# Patient Record
Sex: Female | Born: 1950 | Race: Black or African American | Hispanic: No | Marital: Single | Smoking: Current every day smoker
Health system: Southern US, Community
[De-identification: ages and names within clinical notes are randomized; demographics above are authoritative.]

## PROBLEM LIST (undated history)

## (undated) DIAGNOSIS — G309 Alzheimer's disease, unspecified: Secondary | ICD-10-CM

## (undated) DIAGNOSIS — R569 Unspecified convulsions: Secondary | ICD-10-CM

## (undated) DIAGNOSIS — I10 Essential (primary) hypertension: Secondary | ICD-10-CM

## (undated) DIAGNOSIS — F0280 Dementia in other diseases classified elsewhere without behavioral disturbance: Secondary | ICD-10-CM

## (undated) HISTORY — PX: ABDOMINAL HYSTERECTOMY: SHX81

---

## 2015-08-08 ENCOUNTER — Inpatient Hospital Stay (HOSPITAL_COMMUNITY): Payer: Federal, State, Local not specified - PPO

## 2015-08-08 ENCOUNTER — Encounter (HOSPITAL_COMMUNITY): Payer: Self-pay | Admitting: *Deleted

## 2015-08-08 ENCOUNTER — Emergency Department (HOSPITAL_COMMUNITY): Payer: Federal, State, Local not specified - PPO

## 2015-08-08 ENCOUNTER — Inpatient Hospital Stay (HOSPITAL_COMMUNITY)
Admission: EM | Admit: 2015-08-08 | Discharge: 2015-08-12 | DRG: 689 | Disposition: A | Discharge status: Home Health | Payer: Federal, State, Local not specified - PPO | Attending: Internal Medicine | Admitting: Internal Medicine

## 2015-08-08 DIAGNOSIS — G309 Alzheimer's disease, unspecified: Secondary | ICD-10-CM | POA: Diagnosis present

## 2015-08-08 DIAGNOSIS — R410 Disorientation, unspecified: Secondary | ICD-10-CM | POA: Diagnosis present

## 2015-08-08 DIAGNOSIS — F1021 Alcohol dependence, in remission: Secondary | ICD-10-CM

## 2015-08-08 DIAGNOSIS — F1010 Alcohol abuse, uncomplicated: Secondary | ICD-10-CM | POA: Diagnosis present

## 2015-08-08 DIAGNOSIS — Z79899 Other long term (current) drug therapy: Secondary | ICD-10-CM | POA: Diagnosis not present

## 2015-08-08 DIAGNOSIS — R935 Abnormal findings on diagnostic imaging of other abdominal regions, including retroperitoneum: Secondary | ICD-10-CM | POA: Diagnosis not present

## 2015-08-08 DIAGNOSIS — K7031 Alcoholic cirrhosis of liver with ascites: Secondary | ICD-10-CM | POA: Insufficient documentation

## 2015-08-08 DIAGNOSIS — N3281 Overactive bladder: Secondary | ICD-10-CM | POA: Diagnosis present

## 2015-08-08 DIAGNOSIS — K769 Liver disease, unspecified: Secondary | ICD-10-CM | POA: Insufficient documentation

## 2015-08-08 DIAGNOSIS — E860 Dehydration: Secondary | ICD-10-CM | POA: Diagnosis present

## 2015-08-08 DIAGNOSIS — N179 Acute kidney failure, unspecified: Secondary | ICD-10-CM | POA: Diagnosis present

## 2015-08-08 DIAGNOSIS — R400 Somnolence: Secondary | ICD-10-CM | POA: Diagnosis present

## 2015-08-08 DIAGNOSIS — R634 Abnormal weight loss: Secondary | ICD-10-CM | POA: Diagnosis not present

## 2015-08-08 DIAGNOSIS — Z7982 Long term (current) use of aspirin: Secondary | ICD-10-CM

## 2015-08-08 DIAGNOSIS — N390 Urinary tract infection, site not specified: Secondary | ICD-10-CM

## 2015-08-08 DIAGNOSIS — N12 Tubulo-interstitial nephritis, not specified as acute or chronic: Principal | ICD-10-CM | POA: Insufficient documentation

## 2015-08-08 DIAGNOSIS — F0280 Dementia in other diseases classified elsewhere without behavioral disturbance: Secondary | ICD-10-CM | POA: Diagnosis present

## 2015-08-08 DIAGNOSIS — E43 Unspecified severe protein-calorie malnutrition: Secondary | ICD-10-CM | POA: Diagnosis present

## 2015-08-08 DIAGNOSIS — R7881 Bacteremia: Secondary | ICD-10-CM | POA: Diagnosis present

## 2015-08-08 DIAGNOSIS — G40909 Epilepsy, unspecified, not intractable, without status epilepticus: Secondary | ICD-10-CM | POA: Diagnosis present

## 2015-08-08 DIAGNOSIS — R630 Anorexia: Secondary | ICD-10-CM | POA: Diagnosis present

## 2015-08-08 DIAGNOSIS — I517 Cardiomegaly: Secondary | ICD-10-CM | POA: Diagnosis present

## 2015-08-08 DIAGNOSIS — I10 Essential (primary) hypertension: Secondary | ICD-10-CM | POA: Diagnosis present

## 2015-08-08 DIAGNOSIS — B9689 Other specified bacterial agents as the cause of diseases classified elsewhere: Secondary | ICD-10-CM | POA: Diagnosis present

## 2015-08-08 DIAGNOSIS — S0003XA Contusion of scalp, initial encounter: Secondary | ICD-10-CM | POA: Diagnosis present

## 2015-08-08 DIAGNOSIS — F17210 Nicotine dependence, cigarettes, uncomplicated: Secondary | ICD-10-CM | POA: Diagnosis present

## 2015-08-08 DIAGNOSIS — B9620 Unspecified Escherichia coli [E. coli] as the cause of diseases classified elsewhere: Secondary | ICD-10-CM | POA: Diagnosis present

## 2015-08-08 DIAGNOSIS — Z681 Body mass index (BMI) 19 or less, adult: Secondary | ICD-10-CM | POA: Diagnosis not present

## 2015-08-08 DIAGNOSIS — W19XXXA Unspecified fall, initial encounter: Secondary | ICD-10-CM | POA: Diagnosis present

## 2015-08-08 DIAGNOSIS — R64 Cachexia: Secondary | ICD-10-CM | POA: Diagnosis present

## 2015-08-08 DIAGNOSIS — Y929 Unspecified place or not applicable: Secondary | ICD-10-CM | POA: Diagnosis not present

## 2015-08-08 DIAGNOSIS — R52 Pain, unspecified: Secondary | ICD-10-CM

## 2015-08-08 DIAGNOSIS — K7460 Unspecified cirrhosis of liver: Secondary | ICD-10-CM | POA: Diagnosis present

## 2015-08-08 DIAGNOSIS — Z9181 History of falling: Secondary | ICD-10-CM | POA: Diagnosis not present

## 2015-08-08 DIAGNOSIS — I739 Peripheral vascular disease, unspecified: Secondary | ICD-10-CM | POA: Diagnosis present

## 2015-08-08 DIAGNOSIS — A4151 Sepsis due to Escherichia coli [E. coli]: Secondary | ICD-10-CM | POA: Insufficient documentation

## 2015-08-08 HISTORY — DX: Dementia in other diseases classified elsewhere, unspecified severity, without behavioral disturbance, psychotic disturbance, mood disturbance, and anxiety: F02.80

## 2015-08-08 HISTORY — DX: Unspecified convulsions: R56.9

## 2015-08-08 HISTORY — DX: Essential (primary) hypertension: I10

## 2015-08-08 HISTORY — DX: Alzheimer's disease, unspecified: G30.9

## 2015-08-08 LAB — URINALYSIS, ROUTINE W REFLEX MICROSCOPIC
GLUCOSE, UA: NEGATIVE mg/dL
Ketones, ur: NEGATIVE mg/dL
Nitrite: NEGATIVE
PROTEIN: 30 mg/dL — AB
SPECIFIC GRAVITY, URINE: 1.013 (ref 1.005–1.030)
Urobilinogen, UA: 1 mg/dL (ref 0.0–1.0)
pH: 5.5 (ref 5.0–8.0)

## 2015-08-08 LAB — CBC
HEMATOCRIT: 35 % — AB (ref 36.0–46.0)
HEMOGLOBIN: 11.5 g/dL — AB (ref 12.0–15.0)
MCH: 27.2 pg (ref 26.0–34.0)
MCHC: 32.9 g/dL (ref 30.0–36.0)
MCV: 82.7 fL (ref 78.0–100.0)
Platelets: 192 10*3/uL (ref 150–400)
RBC: 4.23 MIL/uL (ref 3.87–5.11)
RDW: 14.9 % (ref 11.5–15.5)
WBC: 17.3 10*3/uL — ABNORMAL HIGH (ref 4.0–10.5)

## 2015-08-08 LAB — URINE MICROSCOPIC-ADD ON

## 2015-08-08 LAB — CBG MONITORING, ED: Glucose-Capillary: 83 mg/dL (ref 65–99)

## 2015-08-08 LAB — COMPREHENSIVE METABOLIC PANEL
ALBUMIN: 3 g/dL — AB (ref 3.5–5.0)
ALK PHOS: 78 U/L (ref 38–126)
ALT: 14 U/L (ref 14–54)
AST: 33 U/L (ref 15–41)
Anion gap: 14 (ref 5–15)
BILIRUBIN TOTAL: 1.1 mg/dL (ref 0.3–1.2)
BUN: 33 mg/dL — AB (ref 6–20)
CO2: 17 mmol/L — ABNORMAL LOW (ref 22–32)
CREATININE: 1.81 mg/dL — AB (ref 0.44–1.00)
Calcium: 9.3 mg/dL (ref 8.9–10.3)
Chloride: 103 mmol/L (ref 101–111)
GFR calc Af Amer: 33 mL/min — ABNORMAL LOW (ref >60)
GFR, EST NON AFRICAN AMERICAN: 29 mL/min — AB (ref >60)
GLUCOSE: 113 mg/dL — AB (ref 65–99)
POTASSIUM: 4.7 mmol/L (ref 3.5–5.1)
Sodium: 134 mmol/L — ABNORMAL LOW (ref 135–145)
Total Protein: 7.5 g/dL (ref 6.5–8.1)

## 2015-08-08 LAB — VITAMIN B12: Vitamin B-12: 3509 pg/mL — ABNORMAL HIGH (ref 180–914)

## 2015-08-08 LAB — TSH: TSH: 1.462 u[IU]/mL (ref 0.350–4.500)

## 2015-08-08 MED ORDER — DEXTROSE 5 % IV SOLN: 1.0000 g | INTRAVENOUS | Status: DC

## 2015-08-08 MED ORDER — SENNA 8.6 MG PO TABS
1.0000 | ORAL_TABLET | Freq: Two times a day (BID) | ORAL | Status: DC
  Administered 2015-08-08 – 2015-08-12 (×7): 8.6 mg via ORAL
  Filled 2015-08-08 (×8): qty 1

## 2015-08-08 MED ORDER — ONDANSETRON HCL 4 MG/2ML IJ SOLN: 4.0000 mg | Freq: Four times a day (QID) | INTRAMUSCULAR | Status: DC | PRN

## 2015-08-08 MED ORDER — DEXTROSE 5 % IV SOLN
1.0000 g | Freq: Once | INTRAVENOUS | Status: AC
  Administered 2015-08-08: 1 g via INTRAVENOUS
  Filled 2015-08-08: qty 10

## 2015-08-08 MED ORDER — ADULT MULTIVITAMIN W/MINERALS CH
1.0000 | ORAL_TABLET | Freq: Every day | ORAL | Status: DC
  Administered 2015-08-08 – 2015-08-12 (×5): 1 via ORAL
  Filled 2015-08-08 (×5): qty 1

## 2015-08-08 MED ORDER — LEVETIRACETAM 250 MG PO TABS
250.0000 mg | ORAL_TABLET | Freq: Two times a day (BID) | ORAL | Status: DC
  Administered 2015-08-08 – 2015-08-12 (×7): 250 mg via ORAL
  Filled 2015-08-08 (×8): qty 1

## 2015-08-08 MED ORDER — FOLIC ACID 1 MG PO TABS
1.0000 mg | ORAL_TABLET | Freq: Every day | ORAL | Status: DC
  Administered 2015-08-08 – 2015-08-12 (×5): 1 mg via ORAL
  Filled 2015-08-08 (×5): qty 1

## 2015-08-08 MED ORDER — SODIUM CHLORIDE 0.9 % IV BOLUS (SEPSIS)
1000.0000 mL | Freq: Once | INTRAVENOUS | Status: AC
  Administered 2015-08-08: 1000 mL via INTRAVENOUS

## 2015-08-08 MED ORDER — ONDANSETRON HCL 4 MG PO TABS: 4.0000 mg | ORAL_TABLET | Freq: Four times a day (QID) | ORAL | Status: DC | PRN

## 2015-08-08 MED ORDER — ENOXAPARIN SODIUM 40 MG/0.4ML ~~LOC~~ SOLN
40.0000 mg | SUBCUTANEOUS | Status: DC
  Administered 2015-08-08 – 2015-08-11 (×4): 40 mg via SUBCUTANEOUS
  Filled 2015-08-08 (×4): qty 0.4

## 2015-08-08 MED ORDER — ASPIRIN EC 81 MG PO TBEC
81.0000 mg | DELAYED_RELEASE_TABLET | Freq: Every day | ORAL | Status: DC
  Administered 2015-08-08 – 2015-08-12 (×5): 81 mg via ORAL
  Filled 2015-08-08 (×5): qty 1

## 2015-08-08 MED ORDER — ACETAMINOPHEN 650 MG RE SUPP: 650.0000 mg | Freq: Four times a day (QID) | RECTAL | Status: DC | PRN

## 2015-08-08 MED ORDER — RISPERIDONE 0.5 MG PO TABS
2.0000 mg | ORAL_TABLET | Freq: Two times a day (BID) | ORAL | Status: DC
  Administered 2015-08-08 – 2015-08-09 (×2): 2 mg via ORAL
  Filled 2015-08-08 (×2): qty 4

## 2015-08-08 MED ORDER — SODIUM CHLORIDE 0.9 % IV SOLN
INTRAVENOUS | Status: DC
  Administered 2015-08-08 – 2015-08-11 (×4): via INTRAVENOUS

## 2015-08-08 MED ORDER — VITAMIN B-1 100 MG PO TABS
100.0000 mg | ORAL_TABLET | Freq: Every day | ORAL | Status: DC
  Administered 2015-08-08 – 2015-08-12 (×5): 100 mg via ORAL
  Filled 2015-08-08 (×5): qty 1

## 2015-08-08 MED ORDER — ACETAMINOPHEN 325 MG PO TABS: 650.0000 mg | ORAL_TABLET | Freq: Four times a day (QID) | ORAL | Status: DC | PRN

## 2015-08-08 NOTE — ED Provider Notes (Signed)
CSN: [id number]     Arrival date & time 08/08/15  1225 History   First MD Initiated Contact with Patient 08/08/15 1329     Chief Complaint  Patient presents with  . Altered Mental Status  . Fever     (Consider location/radiation/quality/duration/timing/severity/associated sxs/prior Treatment) HPI Patient presents with her sister who provide the history of present illness. Sister reports that patient has a notable change in interactivity for the past 2 weeks, with listlessness, anorexia, no vomiting, mild fever. Patient cannot provide the details, as she is listless, a phasic, level V caveat. Sister states that the patient was diagnosed with dementia, earlier this year, but has not followed up with neurology. During this illness of 2 weeks, the patient has been to one emergency department, with diagnosis of dehydration.  Past Medical History  Diagnosis Date  . Hypertension   . Seizures (HCC)   . Alzheimer's dementia    Past Surgical History  Procedure Laterality Date  . Abdominal hysterectomy     No family history on file. Social History  Substance Use Topics  . Smoking status: Current Every Day Smoker  . Smokeless tobacco: None  . Alcohol Use: Yes     Comment: used to drink daily until february and then she quit   OB History    No data available     Review of Systems  Unable to perform ROS: Acuity of condition      Allergies  Review of patient's allergies indicates no known allergies.  Home Medications   Prior to Admission medications   Medication Sig Start Date End Date Taking? Authorizing Provider  aMILoride (MIDAMOR) 5 MG tablet Take 5 mg by mouth daily.   Yes Historical Provider, MD  aspirin EC 81 MG tablet Take 81 mg by mouth daily.   Yes Historical Provider, MD  Cyanocobalamin (VITAMIN B 12 PO) Take 2,000 mcg by mouth daily.   Yes Historical Provider, MD  levETIRAcetam (KEPPRA) 250 MG tablet Take 250 mg by mouth 2 (two) times daily.   Yes Historical  Provider, MD  lisinopril (PRINIVIL,ZESTRIL) 40 MG tablet Take 40 mg by mouth daily.   Yes Historical Provider, MD  mirabegron ER (MYRBETRIQ) 25 MG TB24 tablet Take 25 mg by mouth daily.   Yes Historical Provider, MD  Multiple Vitamins-Minerals (MULTIVITAMIN WITH MINERALS) tablet Take 1 tablet by mouth daily.   Yes Historical Provider, MD  niacin 250 MG tablet Take 250 mg by mouth at bedtime.   Yes Historical Provider, MD  Potassium 99 MG TABS Take 99 mg by mouth daily.   Yes Historical Provider, MD  risperiDONE (RISPERDAL) 2 MG tablet Take 2 mg by mouth 2 (two) times daily.   Yes Historical Provider, MD   BP 132/82 mmHg  Pulse 75  Temp(Src) 98.6 F (37 C) (Oral)  Resp 16  SpO2 99% Physical Exam  Constitutional: She is oriented to person, place, and time. She appears listless. She appears cachectic. She has a sickly appearance. No distress.  HENT:  Head: Normocephalic and atraumatic.  Eyes: Conjunctivae and EOM are normal.  Cardiovascular: Normal rate and regular rhythm.   Pulmonary/Chest: Effort normal and breath sounds normal. No stridor. No respiratory distress.  Abdominal: She exhibits no distension.  Musculoskeletal: She exhibits no edema.  Neurological: She is oriented to person, place, and time. She appears listless. She displays atrophy. She displays no tremor. No cranial nerve deficit. She exhibits abnormal muscle tone. She displays no seizure activity.  Patient does not follow commands  reliably, is minimally awake, minimally verbal. No gross asymmetry of strength.  Skin: Skin is warm and dry.  Psychiatric: Cognition and memory are impaired. She is noncommunicative.  Nursing note and vitals reviewed.   ED Course  Procedures (including critical care time) Labs Review Labs Reviewed  COMPREHENSIVE METABOLIC PANEL - Abnormal; Notable for the following:    Sodium 134 (*)    CO2 17 (*)    Glucose, Bld 113 (*)    BUN 33 (*)    Creatinine, Ser 1.81 (*)    Albumin 3.0 (*)     GFR calc non Af Amer 29 (*)    GFR calc Af Amer 33 (*)    All other components within normal limits  CBC - Abnormal; Notable for the following:    WBC 17.3 (*)    Hemoglobin 11.5 (*)    HCT 35.0 (*)    All other components within normal limits  URINALYSIS, ROUTINE W REFLEX MICROSCOPIC (NOT AT [facility]) - Abnormal; Notable for the following:    Color, Urine AMBER (*)    APPearance TURBID (*)    Hgb urine dipstick SMALL (*)    Bilirubin Urine SMALL (*)    Protein, ur 30 (*)    Leukocytes, UA LARGE (*)    All other components within normal limits  URINE MICROSCOPIC-ADD ON  CBG MONITORING, ED    Imaging Review Ct Head Wo Contrast  08/08/2015  CLINICAL DATA:  Fall.  History of Alzheimer's dementia. EXAM: CT HEAD WITHOUT CONTRAST TECHNIQUE: Contiguous axial images were obtained from the base of the skull through the vertex without intravenous contrast. COMPARISON:  None. FINDINGS: There is age related volume loss. There is no intracranial mass, hemorrhage, extra-axial fluid collection, midline shift. There is patchy small vessel disease in the centra semiovale bilaterally. Elsewhere gray-white compartments appear normal. No acute infarct is evident. There is a small right frontal scalp hematoma. Bony calvarium appears intact. The mastoid air cells are clear. IMPRESSION: Age related volume loss with patchy periventricular small vessel disease. No intracranial mass, hemorrhage, or extra-axial fluid collection. No acute infarct evident. Right frontal scalp hematoma without evidence of fracture. Electronically Signed   By: Bretta Bang III M.D.   On: 08/08/2015 15:28   I have personally reviewed and evaluated these images and lab results as part of my medical decision-making.   EKG Interpretation   Date/Time:  Monday August 08 2015 12:53:07 EDT Ventricular Rate:  95 PR Interval:  134 QRS Duration: 74 QT Interval:  336 QTC Calculation: 422 R Axis:   77 Text Interpretation:  Normal sinus  rhythm Nonspecific ST abnormality  Abnormal ECG Sinus rhythm Artifact Abnormal ekg Confirmed by Gerhard Munch  MD [phone]) on 08/08/2015 1:39:19 PM     Initial labs notable for elevated creatinine, urinalysis suggests renal dysfunction, head CT demonstrates age appropriate changes, no obvious stroke. Patient is hypoxic, and there is low suspicion for pneumonia.  With concern for urinary tract infection, dehydration, the patient continued to receive IV fluids, ceftriaxone. MDM  Elderly female presents with a family member due to concerns of decreased functionality. Here the patient is minimally awake, listless, not overtly septic. Patient is afebrile, but has evidence for urinary tract infection, with diminished renal function, and given her change in cognitive status, received IV fluids, antibiotics, was admitted for further evaluation and management.  Gerhard Munch, MD 08/08/15 [phone]

## 2015-08-08 NOTE — H&P (Signed)
Date: 08/08/2015               Patient Name:  Carla Marsh MRN: [id number]  DOB: 10-14-1951 Age / Sex: [age] y.o., female   PCP: No primary care provider on file.         Medical Service: Internal Medicine Teaching Service         Attending Physician: Dr. Inez CatalinaEmily B Mullen, MD    First Contact: Dr. Selina CooleyKyle Ferdie Bakken Pager: [phone]  Second Contact: Dr. Jill AlexandersAlexa Richardson Pager: [phone]       After Hours (After 5p/  First Contact Pager: [phone]  weekends / holidays): Second Contact Pager: [phone]   Chief Complaint: "She's been falling," per Carla Marsh sister  History of Present Illness: Carla Marsh is a 64 year old lady with a history of hypertension, seizure disorder, 50 pack-year smoking history, and extensive alcohol abuse history presenting with increasing falls and somnolence. The history was provided by Carla Marsh sister as the patient herself is not cogent.  Carla Marsh sister said she moved from [place] to [place], [place] about 3 years ago to take care of Carla Marsh mother, who has severe Alzheimer's dementia. During that time, Carla Marsh health has declined rapidly, and she's lost about 200 pounds in the last year. She's become more and more demented during that time, but she can feed herself by microwaving, bathe herself, and function quite well with the help from Carla Marsh mother's caretaker.  Two weeks ago, she was hospitalized in [place], and told she had seizures after getting an EEG. She was started on Keppra at that time and has been falling more since then. Today she fell backwards with the dresser and it fell on top of Carla Marsh, but she says she didn't injure herself, and she declines any complaints whatsoever, specifically including night sweats, chills, headache, cough, shortness of breath, dysuria, or diarrhea. She says Carla Marsh sister made Carla Marsh come in, and she is what's bothering Carla Marsh the most.  Carla Marsh last mammogram was last year and reportedly normal, and Carla Marsh last colonoscopy was 10 years ago and was also reportedly normal.  Besides alcohol and tobacco, there's no other drug use the sister is aware of. She continue to smoke but quit drinking back in February after a previous hospitalization. No cancer runs in the family.  In the emergency department, she was afebrile and Carla Marsh vital signs were normal. Labs were notable for a leukocytosis of 17, BMP notable for BUN 33, creatinine 1.81. Urinalysis showed many bacteria and leukocytes, but negative nitrites. Chest x-ray was normal and head CT showed age-related volume loss and a right frontal scalp hematoma, without evidence of an acute bleed or fracture.  Meds: Current Facility-Administered Medications  Medication Dose Route Frequency Provider Last Rate Last Dose  . cefTRIAXone (ROCEPHIN) 1 g in dextrose 5 % 50 mL IVPB  1 g Intravenous Once Gerhard Munchobert Lockwood, MD       Current Outpatient Prescriptions  Medication Sig Dispense Refill  . aMILoride (MIDAMOR) 5 MG tablet Take 5 mg by mouth daily.    Marland Kitchen. aspirin EC 81 MG tablet Take 81 mg by mouth daily.    . Cyanocobalamin (VITAMIN B 12 PO) Take 2,000 mcg by mouth daily.    Marland Kitchen. levETIRAcetam (KEPPRA) 250 MG tablet Take 250 mg by mouth 2 (two) times daily.    Marland Kitchen. lisinopril (PRINIVIL,ZESTRIL) 40 MG tablet Take 40 mg by mouth daily.    . mirabegron ER (MYRBETRIQ) 25 MG TB24 tablet Take 25 mg by mouth daily.    .Marland Kitchen  Multiple Vitamins-Minerals (MULTIVITAMIN WITH MINERALS) tablet Take 1 tablet by mouth daily.    . niacin 250 MG tablet Take 250 mg by mouth at bedtime.    . Potassium 99 MG TABS Take 99 mg by mouth daily.    . risperiDONE (RISPERDAL) 2 MG tablet Take 2 mg by mouth 2 (two) times daily.      Allergies: Allergies as of 08/08/2015  . (No Known Allergies)   Past Medical History  Diagnosis Date  . Hypertension   . Seizures (HCC)   . Alzheimer's dementia    Past Surgical History  Procedure Laterality Date  . Abdominal hysterectomy     Family history: Mother: Alzheimers, HTN, hypothyroidism, CHF Father: MI at 69,  diabetes  Social History   Social History  . Marital Status: Single    Spouse Name: N/A  . Number of Children: N/A  . Years of Education: N/A   Occupational History  . Not on file.   Social History Main Topics  . Smoking status: Current Every Day Smoker  . Smokeless tobacco: Not on file  . Alcohol Use: Yes     Comment: used to drink daily until february and then she quit  . Drug Use: No  . Sexual Activity: Not on file   Other Topics Concern  . Not on file   Social History Narrative  . No narrative on file   Review of Systems  Constitutional: Positive for weight loss. Negative for fever, chills and malaise/fatigue.  Eyes: Negative for blurred vision.  Respiratory: Negative for cough, sputum production and shortness of breath.   Cardiovascular: Negative for chest pain.  Gastrointestinal: Negative for nausea, vomiting, abdominal pain, diarrhea and constipation.  Genitourinary: Negative for dysuria and urgency.  Musculoskeletal: Positive for neck pain. Negative for myalgias.  Skin: Negative for rash.  Neurological: Positive for seizures. Negative for dizziness, tremors, focal weakness, weakness and headaches.  Psychiatric/Behavioral: Positive for substance abuse.   Physical Exam: Blood pressure 136/80, pulse 71, temperature 98.6 F (37 C), temperature source Oral, resp. rate 23, SpO2 100 %. General: cachectic appearing African-American lady, somnolent and minimally interactive HEENT: no scleral icterus, extra-ocular muscles intact, oropharynx without lesions Cardiac: regular rate and rhythm, no rubs, murmurs or gallops Pulm: breathing well, clear to auscultation bilaterally Abd: bowel sounds normal, soft, nondistended, non-tender Ext: warm and well perfused, without pedal edema Lymph: no cervical, axillary, or supraclavicular lymphadenopathy Skin: no rash, hair, or nail changes Neuro: alert and oriented X3, cranial nerves II-XII grossly intact, strength 5/5 throughout,  non-focal  Lab results: Basic Metabolic Panel:  Recent Labs  16/10/96 1257  NA 134*  K 4.7  CL 103  CO2 17*  GLUCOSE 113*  BUN 33*  CREATININE 1.81*  CALCIUM 9.3   Liver Function Tests:  Recent Labs  08/08/15 1257  AST 33  ALT 14  ALKPHOS 78  BILITOT 1.1  PROT 7.5  ALBUMIN 3.0*   CBC:  Recent Labs  08/08/15 1257  WBC 17.3*  HGB 11.5*  HCT 35.0*  MCV 82.7  PLT 192   Urinalysis:  Recent Labs  08/08/15 1500  COLORURINE AMBER*  LABSPEC 1.013  PHURINE 5.5  GLUCOSEU NEGATIVE  HGBUR SMALL*  BILIRUBINUR SMALL*  KETONESUR NEGATIVE  PROTEINUR 30*  UROBILINOGEN 1.0  NITRITE NEGATIVE  LEUKOCYTESUR LARGE*   Imaging results:  Dg Chest 2 View  08/08/2015  CLINICAL DATA:  Losing ability to walk for 1 month. EXAM: CHEST  2 VIEW COMPARISON:  None. FINDINGS: There is no  focal parenchymal opacity. There is no pleural effusion or pneumothorax. There is mild cardiomegaly. The osseous structures are unremarkable. IMPRESSION: No active cardiopulmonary disease. Electronically Signed   By: Elige Ko   On: 08/08/2015 16:12   Ct Head Wo Contrast  08/08/2015  CLINICAL DATA:  Fall.  History of Alzheimer's dementia. EXAM: CT HEAD WITHOUT CONTRAST TECHNIQUE: Contiguous axial images were obtained from the base of the skull through the vertex without intravenous contrast. COMPARISON:  None. FINDINGS: There is age related volume loss. There is no intracranial mass, hemorrhage, extra-axial fluid collection, midline shift. There is patchy small vessel disease in the centra semiovale bilaterally. Elsewhere gray-white compartments appear normal. No acute infarct is evident. There is a small right frontal scalp hematoma. Bony calvarium appears intact. The mastoid air cells are clear. IMPRESSION: Age related volume loss with patchy periventricular small vessel disease. No intracranial mass, hemorrhage, or extra-axial fluid collection. No acute infarct evident. Right frontal scalp hematoma  without evidence of fracture. Electronically Signed   By: Bretta Bang III M.D.   On: 08/08/2015 15:28   Other results: EKG: normal EKG, normal sinus rhythm, nonspecific ST and T waves changes.  Assessment & Plan by Problem: Carla Marsh is a 64 year old African American lady presenting with non-specific symptoms of progressive falls, seizures, and bowel and bladder incontinence, found to have a urinary tract infection. We will treat Carla Marsh with Ceftriaxone and monitor Carla Marsh mental status. Some of this may be post-ictal and dehydration. Carla Marsh more longstanding problem of 200 pound weight loss is certainly concerning as well, and may be caused by an occult malignancy, possibly lung cancer given Carla Marsh longstanding smoking history, HIV, or generalized malnutrition from progressive dementia. This will require an extensive outpatient work-up to come up with the etiology.  Urinary tract infection: Per above. -Will treat with Ceftriaxone for now -Follow urine cultures -NS at 125cc/hr  200 pound weight loss in one year: Per above, differential includes malignancy, HIV, or progressive dementia and malnutrition. This will require an extensive outpatient work-up. -Will obtain FOBT -Ordered HIV -Ordered TSH -Nutrition consult  Hypertension: She is normotensive right now. -Holding amiloride  daily -Holding lisinopril  daily  Seizure disorder: She is not actively seizing. -Continue levetiracetam  twice daily  Alzheimer's dementia: She has baseline dementia and lives with Carla Marsh mom in [place], who also has dementia. -Continue risperidone  twice daily -Continue B12  Alcohol abuse: She quit drinking back in February. -Continue B1 and B9 vitamins  Overactive bladder: Likely contributed to Carla Marsh UTI. -Holding mirabegron   Dispo: Disposition is deferred at this time, awaiting improvement of current medical problems.  The patient does not know have a current PCP (No primary care  provider on file.) and does need an [facility] hospital follow-up appointment after discharge.  The patient does have transportation limitations that hinder transportation to clinic appointments.  Signed: Selina Cooley, MD 08/08/2015, 4:41 PM

## 2015-08-08 NOTE — ED Notes (Signed)
Pt has been sick since February in [place].  To has been having falls and family was told she has seizures.  Pt recently seen and treated for dehydration.  Pt fell yesterday and pulled the dresser over on her.  Pt knows self and year, but seems to week to follow directions.  Family feels like her mind and body are not working together,

## 2015-08-08 NOTE — ED Notes (Signed)
Pt transported to CT ?

## 2015-08-08 NOTE — ED Notes (Signed)
Pt in xray

## 2015-08-09 ENCOUNTER — Inpatient Hospital Stay (HOSPITAL_COMMUNITY): Payer: Federal, State, Local not specified - PPO

## 2015-08-09 DIAGNOSIS — E43 Unspecified severe protein-calorie malnutrition: Secondary | ICD-10-CM | POA: Insufficient documentation

## 2015-08-09 LAB — CBC WITH DIFFERENTIAL/PLATELET
BASOS ABS: 0 10*3/uL (ref 0.0–0.1)
BASOS PCT: 0 %
EOS ABS: 0 10*3/uL (ref 0.0–0.7)
EOS PCT: 0 %
HCT: 30.4 % — ABNORMAL LOW (ref 36.0–46.0)
Hemoglobin: 10.3 g/dL — ABNORMAL LOW (ref 12.0–15.0)
Lymphocytes Relative: 3 %
Lymphs Abs: 0.6 10*3/uL — ABNORMAL LOW (ref 0.7–4.0)
MCH: 27.9 pg (ref 26.0–34.0)
MCHC: 33.9 g/dL (ref 30.0–36.0)
MCV: 82.4 fL (ref 78.0–100.0)
MONO ABS: 2.3 10*3/uL — AB (ref 0.1–1.0)
Monocytes Relative: 12 %
Neutro Abs: 16.1 10*3/uL — ABNORMAL HIGH (ref 1.7–7.7)
Neutrophils Relative %: 85 %
PLATELETS: 197 10*3/uL (ref 150–400)
RBC: 3.69 MIL/uL — AB (ref 3.87–5.11)
RDW: 15 % (ref 11.5–15.5)
WBC: 19 10*3/uL — AB (ref 4.0–10.5)

## 2015-08-09 LAB — COMPREHENSIVE METABOLIC PANEL
ALK PHOS: 81 U/L (ref 38–126)
ALT: 16 U/L (ref 14–54)
ANION GAP: 9 (ref 5–15)
AST: 27 U/L (ref 15–41)
Albumin: 2.3 g/dL — ABNORMAL LOW (ref 3.5–5.0)
BUN: 33 mg/dL — ABNORMAL HIGH (ref 6–20)
CALCIUM: 8.4 mg/dL — AB (ref 8.9–10.3)
CO2: 19 mmol/L — AB (ref 22–32)
CREATININE: 1.48 mg/dL — AB (ref 0.44–1.00)
Chloride: 106 mmol/L (ref 101–111)
GFR, EST AFRICAN AMERICAN: 43 mL/min — AB (ref >60)
GFR, EST NON AFRICAN AMERICAN: 37 mL/min — AB (ref >60)
Glucose, Bld: 98 mg/dL (ref 65–99)
Potassium: 3.8 mmol/L (ref 3.5–5.1)
SODIUM: 134 mmol/L — AB (ref 135–145)
Total Bilirubin: 1 mg/dL (ref 0.3–1.2)
Total Protein: 6.2 g/dL — ABNORMAL LOW (ref 6.5–8.1)

## 2015-08-09 LAB — HIV ANTIBODY (ROUTINE TESTING W REFLEX): HIV SCREEN 4TH GENERATION: NONREACTIVE

## 2015-08-09 LAB — RPR: RPR: NONREACTIVE

## 2015-08-09 MED ORDER — PRO-STAT SUGAR FREE PO LIQD
30.0000 mL | Freq: Two times a day (BID) | ORAL | Status: DC
  Administered 2015-08-09 – 2015-08-12 (×6): 30 mL via ORAL
  Filled 2015-08-09 (×6): qty 30

## 2015-08-09 MED ORDER — PIPERACILLIN-TAZOBACTAM 3.375 G IVPB 30 MIN
3.3750 g | INTRAVENOUS | Status: AC
  Administered 2015-08-09: 3.375 g via INTRAVENOUS
  Filled 2015-08-09: qty 50

## 2015-08-09 MED ORDER — SODIUM CHLORIDE 0.9 % IV BOLUS (SEPSIS)
1000.0000 mL | Freq: Once | INTRAVENOUS | Status: AC
  Administered 2015-08-09: 1000 mL via INTRAVENOUS

## 2015-08-09 MED ORDER — ENSURE ENLIVE PO LIQD
237.0000 mL | Freq: Three times a day (TID) | ORAL | Status: DC
  Administered 2015-08-09 – 2015-08-12 (×9): 237 mL via ORAL
  Filled 2015-08-09 (×13): qty 237

## 2015-08-09 MED ORDER — PIPERACILLIN-TAZOBACTAM 3.375 G IVPB
3.3750 g | Freq: Three times a day (TID) | INTRAVENOUS | Status: DC
  Administered 2015-08-09 – 2015-08-10 (×3): 3.375 g via INTRAVENOUS
  Filled 2015-08-09 (×4): qty 50

## 2015-08-09 NOTE — Progress Notes (Signed)
Initial Nutrition Assessment  DOCUMENTATION CODES:   Underweight, Severe malnutrition in context of chronic illness  INTERVENTION:  - Order Ensure Enlive TID (each supplement contains 350 kcal, and 20 gm protein).  - Order 30 mL Pro-Stat BID (each supplement contains 100 kcal and 15 gm protein).  - Recommend appetite stimulant for patient to improve poor PO intake.  - RD team will continue to monitor patient for needs.   NUTRITION DIAGNOSIS:   Malnutrition related to lethargy/confusion as evidenced by severe depletion of body fat, severe depletion of muscle mass, percent weight loss.   GOAL:   Patient will meet greater than or equal to 90% of their needs, Weight gain   MONITOR:   PO intake, Supplement acceptance, Labs, Weight trends, TF tolerance, Skin, I & O's  REASON FOR ASSESSMENT:   Consult Assessment of nutrition requirement/status  ASSESSMENT:   64 y.o. female admitted for UTI. Patient's prior medical history includes: HTN, seizures, smoking, dementia, 50 pack-year smoking history, and extensive alcohol abuse history presenting with increasing falls and somnolence. Patietn's admission history was provided by her sister as the patient herself was not cogent.  Patient was sleepy during visit with moderate alertness and responsiveness to questions, and only minimal historical data was able to be obtained. Patient visited for consult due to recent weight loss of 200 lbs per sister.   Per nurse, patient's PO intake is poor, and for breakfast she only ate a few spoonfuls of her tray. Patient affirms having a poor appetite for several days, having last felt a normal appetite over the weekend. Patient denied any taste changes, nausea or vomiting.   There was minimal anthropometric data in chart at initial assessment. Per patient, she described her height as 6'1" and recent weight as 110 lbs. Her current weight is 113.7 lbs. Per patient, she described a recent weight loss of 50  lbs over the past 5-6 months. This 30% weight loss is significant for the time frame.   NFPE was performed, and patient has moderate to severe fat wasting: orbital, upper arm; severe muscle wasting: knee, calves, thighs, temple, clavicle, and shoulders. Patient meets criteria for severe malnutrition in chronic illness.   Patient affirmed that she has used supplements previously and was familiar with Ensure Enlive. Will order supplements for patients and monitor acceptance.   Per chart, patient will most likely discharge to a skilled nursing facility for continued care.   Labs: low Na (134), high BUN (33), high Cr (1.48), low Ca (8.4), low Heme (10.3), high Vit B12  Medications reviewed.     Diet Order:  Diet NPO time specified  Skin:  Reviewed, no issues  Last BM:  PTA  Height:   Ht Readings from Last 1 Encounters:  08/09/15 6\' 1"  (1.854 m)    Weight:   Wt Readings from Last 1 Encounters:  08/09/15 113 lb 11.2 oz (51.574 kg)    Ideal Body Weight:  75 kg  BMI:  Body mass index is 15 kg/(m^2).  Estimated Nutritional Needs:   Kcal:  1500-1700 kcal  Protein:  65-75 gm  Fluid:  1.5 - 1.7 L/day  EDUCATION NEEDS:   Education needs no appropriate at this time  Delano MetzMaggie Andros Channing, Dietetic Intern 08/09/2015 5:42 PM

## 2015-08-09 NOTE — Progress Notes (Signed)
Occupational Therapy Evaluation Patient Details Name: Carla Marsh MRN: [id number] DOB: 12-14-1950 Today's Date: 08/09/2015    History of Present Illness Adm 10/24 with recent falls and confusion; + UTI  PMHx-200 lb weight loss past year; progressive dementia, seizures, tobacco and ETOH abuse   Clinical Impression   Pt admitted with the above diagnoses and presents with below problem list. Pt will benefit from continued acute OT to address the below listed deficits and maximize independence with BADLs prior to d/c to venue below. Per chart, she lived with her mother (also has dementia) and her mother's caregiver assisted her at times. Pt is currently mod to max A for most ADLs. OT to continue to follow acutely.      Follow Up Recommendations  SNF    Equipment Recommendations  Other (comment) (defer to next venue)    Recommendations for Other Services       Precautions / Restrictions Precautions Precautions: Fall      Mobility Bed Mobility Overal bed mobility: Needs Assistance Bed Mobility: Supine to Sit;Sit to Supine     Supine to sit: HOB elevated;Max assist Sit to supine: Max assist;HOB elevated   General bed mobility comments: Pt with flat affect during session, denied feeling tired. Pt with decreased initiation of movements with multimodal cues provided.   Transfers Overall transfer level: Needs assistance Equipment used: None Transfers: Sit to/from Stand Sit to Stand: Min assist         General transfer comment: used grab bars. min A for balance.    Balance Overall balance assessment: Needs assistance;History of Falls Sitting-balance support: Single extremity supported;Feet supported Sitting balance-Leahy Scale: Fair Sitting balance - Comments: sat with mostly RUE support for about 5 minutes.    Standing balance support: Single extremity supported;During functional activity Standing balance-Leahy Scale: Poor Standing balance comment: utilied bed  rail and min A from therapist. Stood EOB about 20 seconds before initiating sitting down                            ADL Overall ADL's : Needs assistance/impaired Eating/Feeding: Sitting;Moderate assistance   Grooming: Sitting;Moderate assistance   Upper Body Bathing: Maximal assistance;Sitting   Lower Body Bathing: Maximal assistance;Sit to/from stand Lower Body Bathing Details (indicate cue type and reason): min A for balance, max A for task completion Upper Body Dressing : Moderate assistance;Sitting   Lower Body Dressing: Maximal assistance;Sit to/from stand Lower Body Dressing Details (indicate cue type and reason): min A for balance, max A for task completion   Toilet Transfer Details (indicate cue type and reason): pt declined ambulation, likely mod A for SPT or short distance ambulation Toileting- Clothing Manipulation and Hygiene: Total assistance     Tub/Shower Transfer Details (indicate cue type and reason): pt declined ambulation, likely mod A for SPT or short distance ambulation Functional mobility during ADLs:  (pt declined functional mobility) General ADL Comments: Pt completed bed mobility with max to total assist. Pt initially refusing standing even when explained the need to change soiled pad. Pt completed sit<>stand with min A and stood about 20 seconds. Declined further functional mobility. No caregivers present during session.      Vision     Perception     Praxis      Pertinent Vitals/Pain Pain Assessment: No/denies pain     Hand Dominance Right   Extremity/Trunk Assessment Upper Extremity Assessment Upper Extremity Assessment: Generalized weakness;Difficult to assess due to impaired cognition  Lower Extremity Assessment Lower Extremity Assessment: Defer to PT evaluation;Generalized weakness;Difficult to assess due to impaired cognition       Communication Communication Communication: No difficulties   Cognition Arousal/Alertness:  Lethargic Behavior During Therapy: Flat affect Overall Cognitive Status: No family/caregiver present to determine baseline cognitive functioning                     General Comments       Exercises       Shoulder Instructions      Home Living Family/patient expects to be discharged to:: Unsure                                 Additional Comments: pt confused and no family present      Prior Functioning/Environment Level of Independence: Needs assistance        Comments: per chart, she lived with her mother (also has dementia) and her mother's caregiver assisted her at times    OT Diagnosis: Generalized weakness;Cognitive deficits;Altered mental status   OT Problem List: Decreased strength;Decreased activity tolerance;Impaired balance (sitting and/or standing);Decreased cognition;Decreased knowledge of use of DME or AE;Decreased safety awareness;Decreased knowledge of precautions   OT Treatment/Interventions: Self-care/ADL training;Therapeutic exercise;DME and/or AE instruction;Therapeutic activities;Patient/family education;Balance training;Cognitive remediation/compensation    OT Goals(Current goals can be found in the care plan section) Acute Rehab OT Goals Patient Stated Goal: get out of here OT Goal Formulation: With patient Time For Goal Achievement: 08/23/15 Potential to Achieve Goals: Good ADL Goals Pt Will Perform Grooming: with set-up;sitting Pt Will Perform Upper Body Bathing: with supervision;sitting Pt Will Perform Lower Body Bathing: with supervision;sit to/from stand Pt Will Perform Upper Body Dressing: with supervision;sitting Pt Will Perform Lower Body Dressing: with supervision;sit to/from stand Pt Will Transfer to Toilet: with supervision;ambulating;bedside commode Pt Will Perform Toileting - Clothing Manipulation and hygiene: with supervision;sitting/lateral leans;sit to/from stand Pt Will Perform Tub/Shower Transfer: with min  guard assist;ambulating;shower seat Pt/caregiver will Perform Home Exercise Program: Increased strength;Both right and left upper extremity;With written HEP provided  OT Frequency: Min 2X/week   Barriers to D/C:            Co-evaluation              End of Session    Activity Tolerance: Patient limited by lethargy Patient left: in bed;with call bell/phone within reach;with bed alarm set   Time: 6962-9528 OT Time Calculation (min): 27 min Charges:  OT General Charges $OT Visit: 1 Procedure OT Evaluation $Initial OT Evaluation Tier I: 1 Procedure OT Treatments $Self Care/Home Management : 8-22 mins G-Codes:    Pilar Grammes 07-Sep-2015, 1:48 PM

## 2015-08-09 NOTE — Progress Notes (Signed)
Patient ID: Carla Marsh, female   DOB: [birth year], 64 y.o.   MRN: [id number]   Subjective: Carla Marsh was much less responsive this morning, barely opening her eyes, but was withdrawing to pain, mostly on her right arm but also her left shoulder. We were unable to obtain any history.  Objective: Vital signs in last 24 hours: Filed Vitals:   08/09/15 0534 08/09/15 0954 08/09/15 1100 08/09/15 1122  BP: 137/80 101/61    Pulse: 98 97    Temp: 99.8 F (37.7 C) 98 F (36.7 C)    TempSrc: Oral Axillary    Resp: 18 20    Height:    6\' 1"  (1.854 m)  Weight:   51.574 kg (113 lb 11.2 oz) 51.574 kg (113 lb 11.2 oz)  SpO2: 99% 98%     General: cachectic appearing African-American lady, somnolent and minimally interactive HEENT: no scleral icterus, extra-ocular muscles intact, oropharynx without lesions Cardiac: regular rate and rhythm, no rubs, murmurs or gallops Pulm: breathing well, clear to auscultation bilaterally Abd: bowel sounds normal, soft, nondistended, tender in her lower quadrants Ext: warm and well perfused, without pedal edema. Withdrawing to pain in right arm and left shoulder Lymph: no cervical, axillary, or supraclavicular lymphadenopathy Skin: no rash, hair, or nail changes Neuro: alert and oriented X3, cranial nerves II-XII grossly intact, strength 5/5 throughout, non-focal   Lab Results: Basic Metabolic Panel:  Recent Labs Lab 08/08/15 1257 08/09/15 0444  NA 134* 134*  K 4.7 3.8  CL 103 106  CO2 17* 19*  GLUCOSE 113* 98  BUN 33* 33*  CREATININE 1.81* 1.48*  CALCIUM 9.3 8.4*   Liver Function Tests:  Recent Labs Lab 08/08/15 1257 08/09/15 0444  AST 33 27  ALT 14 16  ALKPHOS 78 81  BILITOT 1.1 1.0  PROT 7.5 6.2*  ALBUMIN 3.0* 2.3*   CBC:  Recent Labs Lab 08/08/15 1257 08/09/15 0444  WBC 17.3* 19.0*  NEUTROABS  --  16.1*  HGB 11.5* 10.3*  HCT 35.0* 30.4*  MCV 82.7 82.4  PLT 192 197   Urinalysis:  Recent Labs Lab 08/08/15 1500    COLORURINE AMBER*  LABSPEC 1.013  PHURINE 5.5  GLUCOSEU NEGATIVE  HGBUR SMALL*  BILIRUBINUR SMALL*  KETONESUR NEGATIVE  PROTEINUR 30*  UROBILINOGEN 1.0  NITRITE NEGATIVE  LEUKOCYTESUR LARGE*   Micro Results: Recent Results (from the past 240 hour(s))  Culture, Urine     Status: None (Preliminary result)   Collection Time: 08/08/15  3:00 PM  Result Value Ref Range Status   Specimen Description Urine  Final   Special Requests NONE  Final   Culture TOO YOUNG TO READ  Final   Report Status PENDING  Incomplete  Culture, blood (routine x 2)     Status: None (Preliminary result)   Collection Time: 08/08/15  4:41 PM  Result Value Ref Range Status   Specimen Description BLOOD RIGHT FOREARM  Final   Special Requests IN PEDIATRIC BOTTLE 3CC  Final   Culture  Setup Time   Final    GRAM NEGATIVE RODS AEROBIC BOTTLE ONLY CRITICAL RESULT CALLED TO, READ BACK BY AND VERIFIED WITH: A THOMPSON 0627 08/09/15 MKELLY    Culture CULTURE REINCUBATED FOR BETTER GROWTH  Final   Report Status PENDING  Incomplete  Culture, blood (routine x 2)     Status: None (Preliminary result)   Collection Time: 08/08/15  4:46 PM  Result Value Ref Range Status   Specimen Description BLOOD RIGHT ARM  Final  Special Requests BOTTLES DRAWN AEROBIC AND ANAEROBIC 5CC  Final   Culture  Setup Time   Final    GRAM NEGATIVE RODS IN BOTH AEROBIC AND ANAEROBIC BOTTLES CRITICAL RESULT CALLED TO, READ BACK BY AND VERIFIED WITH: A THOMPSON,RN [id number] WILDERK    Culture CULTURE REINCUBATED FOR BETTER GROWTH  Final   Report Status PENDING  Incomplete   Studies/Results: Dg Chest 2 View  08/08/2015  CLINICAL DATA:  Losing ability to walk for 1 month. EXAM: CHEST  2 VIEW COMPARISON:  None. FINDINGS: There is no focal parenchymal opacity. There is no pleural effusion or pneumothorax. There is mild cardiomegaly. The osseous structures are unremarkable. IMPRESSION: No active cardiopulmonary disease. Electronically Signed    By: Elige Ko   On: 08/08/2015 16:12   Ct Head Wo Contrast  08/08/2015  CLINICAL DATA:  Fall.  History of Alzheimer's dementia. EXAM: CT HEAD WITHOUT CONTRAST TECHNIQUE: Contiguous axial images were obtained from the base of the skull through the vertex without intravenous contrast. COMPARISON:  None. FINDINGS: There is age related volume loss. There is no intracranial mass, hemorrhage, extra-axial fluid collection, midline shift. There is patchy small vessel disease in the centra semiovale bilaterally. Elsewhere gray-white compartments appear normal. No acute infarct is evident. There is a small right frontal scalp hematoma. Bony calvarium appears intact. The mastoid air cells are clear. IMPRESSION: Age related volume loss with patchy periventricular small vessel disease. No intracranial mass, hemorrhage, or extra-axial fluid collection. No acute infarct evident. Right frontal scalp hematoma without evidence of fracture. Electronically Signed   By: Bretta Bang III M.D.   On: 08/08/2015 15:28   Medications: I have reviewed the patient's current medications. Scheduled Meds: . aspirin EC  81 mg Oral Daily  . enoxaparin (LOVENOX) injection  40 mg Subcutaneous Q24H  . folic acid  1 mg Oral Daily  . levETIRAcetam  250 mg Oral BID  . multivitamin with minerals  1 tablet Oral Daily  . piperacillin-tazobactam (ZOSYN)  IV  3.375 g Intravenous Q8H  . senna  1 tablet Oral BID  . thiamine  100 mg Oral Daily   Continuous Infusions: . sodium chloride 125 mL/hr at 08/09/15 0414   PRN Meds:.acetaminophen **OR** acetaminophen, ondansetron **OR** ondansetron (ZOFRAN) IV   Assessment/Plan: Carla Marsh was much more somnolent today which may be due to her risperidone she got earlier this morning, her gram negative rod bacteremia, pain from questionable fractures from her recent falls, or ongoing seizure. We'll hold her Risperisone, we changed her Ceftriaxone to Zosyn to cover Pseudomonas as she was  in the hospital 2 weeks ago, we'll get X-rays of her arms to evaluate for fractures, and we'll consider an EEG if her somnolence persists. She's withdrawing to pain in her lower abdomen which I suspect is from her UTI but we may need to scan her if she continues to have this pain. Regarding her extreme weight loss, she was HIV negative, her TSH was normal. We'll obtain an FOBT and she'll need further screening on an outpatient basis.   Gram negative rod bacteremia: Per above. -Changed Ceftriaxone to Zosyn -Follow urine cultures -1L bolus -NS at 125cc/hr  Urinary tract infection: Per above. -Follow urine cultures  200 pound weight loss in one year: Differential includes malignancy versus progressive dementia and malnutrition. This will require an extensive outpatient work-up. -Will obtain FOBT -Nutrition consult  Hypertension: She is normotensive right now. -Holding amiloride  daily -Holding lisinopril  daily  Seizure disorder: She is  not actively seizing. -Continue levetiracetam  twice daily-  Alzheimer's dementia: She has baseline dementia and lives with her mom in [place], who also has dementia. -Holding risperidone  twice daily -Holding B12 as she has supratherapeutic level  Dispo: Disposition is deferred at this time, awaiting improvement of current medical problems.   The patient does not have a current PCP (No primary care provider on file.) and does need an [facility] hospital follow-up appointment after discharge.  The patient does have transportation limitations that hinder transportation to clinic appointments.  .Services Needed at time of discharge: Y = Yes, Blank = No PT:   OT:   RN:   Equipment:   Other:     LOS: 1 day   Selina Cooley, MD 08/09/2015, 11:56 AM

## 2015-08-09 NOTE — Care Management Note (Signed)
Case Management Note  Patient Details  Name: Gaye Alkenster Doom MRN: [id number] Date of Birth: Oct 10, 1951  Subjective/Objective:                    Action/Plan: Patient admitted with UTI and found to have positive blood cultures. Pt lives at home with her mother and has an aide that comes into home to help with mother and the patient. Pt has lost about 200 lbs over the last year (FTT) and has become more confused requiring more assistance. PT recommending SNF. CM will continue to follow for discharge needs.   Expected Discharge Date:                  Expected Discharge Plan:  Skilled Nursing Facility  In-House Referral:     Discharge planning Services     Post Acute Care Choice:    Choice offered to:     DME Arranged:    DME Agency:     HH Arranged:    HH Agency:     Status of Service:  In process, will continue to follow  Medicare Important Message Given:    Date Medicare IM Given:    Medicare IM give by:    Date Additional Medicare IM Given:    Additional Medicare Important Message give by:     If discussed at Long Length of Stay Meetings, dates discussed:    Additional Comments:  Kermit BaloKelli F Darbie Biancardi, RN 08/09/2015, 10:04 AM

## 2015-08-09 NOTE — Progress Notes (Signed)
ANTIBIOTIC CONSULT NOTE - INITIAL  Pharmacy Consult for Zosyn Indication: sepsis  No Known Allergies  Patient Measurements:   Adjusted Body Weight:   Vital Signs: Temp: 98 F (36.7 C) (10/25 0954) Temp Source: Axillary (10/25 0954) BP: 101/61 mmHg (10/25 0954) Pulse Rate: 97 (10/25 0954) Intake/Output from previous day: 10/24 0701 - 10/25 0700 In: 60 [P.O.:60] Out: -  Intake/Output from this shift:    Labs:  Recent Labs  08/08/15 1257 08/09/15 0444  WBC 17.3* 19.0*  HGB 11.5* 10.3*  PLT 192 197  CREATININE 1.81* 1.48*   Est CrCl ~44 ml/min  CrCl cannot be calculated (Unknown ideal weight.). No results for input(s): VANCOTROUGH, VANCOPEAK, VANCORANDOM, GENTTROUGH, GENTPEAK, GENTRANDOM, TOBRATROUGH, TOBRAPEAK, TOBRARND, AMIKACINPEAK, AMIKACINTROU, AMIKACIN in the last 72 hours.   Microbiology: Recent Results (from the past 720 hour(s))  Culture, Urine     Status: None (Preliminary result)   Collection Time: 08/08/15  3:00 PM  Result Value Ref Range Status   Specimen Description Urine  Final   Special Requests NONE  Final   Culture TOO YOUNG TO READ  Final   Report Status PENDING  Incomplete  Culture, blood (routine x 2)     Status: None (Preliminary result)   Collection Time: 08/08/15  4:41 PM  Result Value Ref Range Status   Specimen Description BLOOD RIGHT FOREARM  Final   Special Requests IN PEDIATRIC BOTTLE 3CC  Final   Culture  Setup Time   Final    GRAM NEGATIVE RODS AEROBIC BOTTLE ONLY CRITICAL RESULT CALLED TO, READ BACK BY AND VERIFIED WITH: A THOMPSON 0627 08/09/15 MKELLY    Culture CULTURE REINCUBATED FOR BETTER GROWTH  Final   Report Status PENDING  Incomplete  Culture, blood (routine x 2)     Status: None (Preliminary result)   Collection Time: 08/08/15  4:46 PM  Result Value Ref Range Status   Specimen Description BLOOD RIGHT ARM  Final   Special Requests BOTTLES DRAWN AEROBIC AND ANAEROBIC 5CC  Final   Culture  Setup Time   Final    GRAM  NEGATIVE RODS IN BOTH AEROBIC AND ANAEROBIC BOTTLES CRITICAL RESULT CALLED TO, READ BACK BY AND VERIFIED WITH: A THOMPSON,RN [id number] WILDERK    Culture CULTURE REINCUBATED FOR BETTER GROWTH  Final   Report Status PENDING  Incomplete    Medical History: Past Medical History  Diagnosis Date  . Hypertension   . Seizures (HCC)   . Alzheimer's dementia     Medications:  Prescriptions prior to admission  Medication Sig Dispense Refill Last Dose  . aMILoride (MIDAMOR) 5 MG tablet Take 5 mg by mouth daily.   08/07/2015 at Unknown time  . aspirin EC 81 MG tablet Take 81 mg by mouth daily.   08/07/2015 at Unknown time  . Cyanocobalamin (VITAMIN B 12 PO) Take 2,000 mcg by mouth daily.   08/07/2015 at Unknown time  . levETIRAcetam (KEPPRA) 250 MG tablet Take 250 mg by mouth 2 (two) times daily.   08/07/2015 at Unknown time  . lisinopril (PRINIVIL,ZESTRIL) 40 MG tablet Take 40 mg by mouth daily.   08/07/2015 at Unknown time  . mirabegron ER (MYRBETRIQ) 25 MG TB24 tablet Take 25 mg by mouth daily.   08/07/2015 at Unknown time  . Multiple Vitamins-Minerals (MULTIVITAMIN WITH MINERALS) tablet Take 1 tablet by mouth daily.   08/07/2015 at Unknown time  . niacin 250 MG tablet Take 250 mg by mouth at bedtime.   08/07/2015 at Unknown time  .  Potassium 99 MG TABS Take 99 mg by mouth daily.   08/07/2015 at Unknown time  . risperiDONE (RISPERDAL) 2 MG tablet Take 2 mg by mouth 2 (two) times daily.   08/07/2015 at Unknown time   Assessment: 64 y/o female who presented with increasing falls and somnolence. She was started on ceftriaxone for a UTI however blood cultures are now reporting 2/2 with GNR. Pharmacy consulted to begin Zosyn for sepsis. Tmax is 100.5, WBC are elevated at 19, SCr is trending down and is 1.48 today.  Ceftriaxone 1g x1 on 10/24 Zosyn 10/25>>  10/24 UCx - pending 10/24 BCx2 - GNR  Goal of Therapy:  Eradication of infection  Plan:  - Zosyn 3.375 g IV now over 30 min  followed by 3.375 g IV q8h (infused over 4 hrs) - Monitor renal function, clinical progress, and culture data - Narrow antibiotic as able  [facility]ur[facility] Endoscopy Center LLCJennifer Stryker, WoodsonPharm.D., BCPS Clinical Pharmacist Pager: [phone] 08/09/2015 10:40 AM

## 2015-08-09 NOTE — Progress Notes (Signed)
Called Im teaching service regarding 2 positive blood cultures with gram negative rods. Will continue to monitor. Melina Schoolshompson, Sianna Garofano E, CaliforniaRN 08/09/2015 6:29 AM

## 2015-08-09 NOTE — Evaluation (Signed)
Physical Therapy Evaluation Patient Details Name: Carla Marsh MRN: [id number] DOB: August 07, 1951 Today's Date: 08/09/2015   History of Present Illness  Adm 10/24 with recent falls and confusion; + UTI  PMHx-200 lb weight loss past year; progressive dementia, seizures, tobacco and ETOH abuse    Clinical Impression  Pt admitted with above diagnosis. Pt currently with functional limitations due to the deficits listed below (see PT Problem List). Patient currently with AMS, lethargy, and cannot safely care for herself. Pt will benefit from skilled PT to increase their independence and safety with mobility to allow discharge to the venue listed below.       Follow Up Recommendations SNF    Equipment Recommendations  Other (comment) (TBA)    Recommendations for Other Services OT consult;Speech consult     Precautions / Restrictions Precautions Precautions: Fall      Mobility  Bed Mobility Overal bed mobility: Needs Assistance Bed Mobility: Rolling;Sidelying to Sit;Sit to Supine Rolling: Total assist Sidelying to sit: Total assist   Sit to supine: Min assist   General bed mobility comments: pt very groggy and requried incr assist to EOB, where she became more alert; on return to bed, pt required assist to lift Lt leg only back into bed  Transfers Overall transfer level: Needs assistance Equipment used: None Transfers: Sit to/from Stand Sit to Stand: Min assist         General transfer comment: impulsively stood from EOB due to need to use BR; stood from toilet with grab bar and steady assist  Ambulation/Gait Ambulation/Gait assistance: Mod assist;Min assist Ambulation Distance (Feet): 12 Feet (toileted, 12) Assistive device: None (vs IV pole) Gait Pattern/deviations: Step-through pattern;Decreased stride length;Staggering left;Leaning posteriorly;Drifts right/left Gait velocity: slow   General Gait Details: to bathroom, pt very unsteady and required up to mod  assist to prevent fall; more awake and more steady on return to bed with min assist  Stairs            Wheelchair Mobility    Modified Rankin (Stroke Patients Only)       Balance Overall balance assessment: History of Falls;Needs assistance Sitting-balance support: No upper extremity supported;Feet supported Sitting balance-Leahy Scale: Fair Sitting balance - Comments: once awake   Standing balance support: No upper extremity supported Standing balance-Leahy Scale: Poor                               Pertinent Vitals/Pain Pain Assessment: No/denies pain    Home Living Family/patient expects to be discharged to:: Unsure                 Additional Comments: pt confused and no family present    Prior Function Level of Independence: Needs assistance         Comments: per chart, she lived with her mother (also has dementia) and her mother's caregiver assisted her at times     Hand Dominance   Dominant Hand: Right    Extremity/Trunk Assessment   Upper Extremity Assessment: Generalized weakness;Defer to OT evaluation           Lower Extremity Assessment: Generalized weakness;Difficult to assess due to impaired cognition      Cervical / Trunk Assessment: Normal  Communication   Communication: No difficulties  Cognition Arousal/Alertness: Lethargic Behavior During Therapy: Flat affect Overall Cognitive Status: No family/caregiver present to determine baseline cognitive functioning  General Comments      Exercises        Assessment/Plan    PT Assessment Patient needs continued PT services  PT Diagnosis Difficulty walking;Generalized weakness   PT Problem List Decreased strength;Decreased activity tolerance;Decreased balance;Decreased mobility;Decreased cognition;Decreased knowledge of use of DME;Decreased safety awareness;Decreased knowledge of precautions  PT Treatment Interventions DME  instruction;Gait training;Functional mobility training;Therapeutic activities;Stair training;Therapeutic exercise;Balance training;Cognitive remediation;Patient/family education   PT Goals (Current goals can be found in the Care Plan section) Acute Rehab PT Goals Patient Stated Goal: get out of here PT Goal Formulation: Patient unable to participate in goal setting Time For Goal Achievement: 08/16/15 Potential to Achieve Goals: Fair    Frequency Min 3X/week   Barriers to discharge Decreased caregiver support      Co-evaluation               End of Session Equipment Utilized During Treatment: Gait belt Activity Tolerance: Patient limited by lethargy Patient left: in bed;with call bell/phone within reach;with bed alarm set Nurse Communication: Mobility status         Time: 1610-9604 PT Time Calculation (min) (ACUTE ONLY): 26 min   Charges:   PT Evaluation $Initial PT Evaluation Tier I: 1 Procedure PT Treatments $Gait Training: 8-22 mins   PT G Codes:        Delwyn Scoggin 08-17-15, 9:26 AM Pager [phone]

## 2015-08-09 NOTE — Discharge Summary (Signed)
Name: Carla Marsh MRN: [id number] DOB: 1951-02-20 64 y.o. PCP: No primary care provider on file.  Date of Admission: 08/08/2015  1:22 PM Date of Discharge: 08/09/2015 Attending Physician: Inez Catalina, MD  Discharge Diagnosis: 1. E coli bacteremia secondary to pyelonephritis 2. 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 Seizure disorder 4. Hypertension  Discharge Medications:   Medication List    STOP taking these medications        aMILoride 5 MG tablet  Commonly known as:  MIDAMOR     lisinopril 40 MG tablet  Commonly known as:  PRINIVIL,ZESTRIL     niacin 250 MG tablet     Potassium 99 MG Tabs     VITAMIN B 12 PO      TAKE these medications        aspirin EC 81 MG tablet  Take 81 mg by mouth daily.     ciprofloxacin 500 MG tablet  Commonly known as:  CIPRO  Take 1 tablet (500 mg total) by mouth 2 (two) times daily.     feeding supplement (ENSURE ENLIVE) Liqd  Take 237 mLs by mouth 3 (three) times daily between meals.     feeding supplement (PRO-STAT SUGAR FREE 64) Liqd  Take 30 mLs by mouth 2 (two) times daily.     levETIRAcetam 250 MG tablet  Commonly known as:  KEPPRA  Take 250 mg by mouth 2 (two) times daily.     multivitamin with minerals tablet  Take 1 tablet by mouth daily.     MYRBETRIQ 25 MG Tb24 tablet  Generic drug:  mirabegron ER  Take 25 mg by mouth daily.     risperiDONE 1 MG tablet  Commonly known as:  RISPERDAL  Take 1 tablet (1 mg total) by mouth at bedtime.     senna 8.6 MG Tabs tablet  Commonly known as:  SENOKOT  Take 1 tablet (8.6 mg total) by mouth 2 (two) times daily.        Disposition and follow-up:   [name] was discharged from [facility] in Good condition.  At the hospital follow up visit please address:  Abdominal MRI with gadolinium enhancement to further characterize hypervascular lesions in liver concerning for [facility] Her functional  and nutritional status Consider checking a urinalysis as she has had multiple sentinel UTIs  Procedures Performed:  Dg Chest 2 View  08/08/2015  CLINICAL DATA:  Losing ability to walk for 1 month. EXAM: CHEST  2 VIEW COMPARISON:  None. FINDINGS: There is no focal parenchymal opacity. There is no pleural effusion or pneumothorax. There is mild cardiomegaly. The osseous structures are unremarkable. IMPRESSION: No active cardiopulmonary disease. Electronically Signed   By: Elige Ko   On: 08/08/2015 16:12   Dg Ribs Bilateral  08/09/2015  CLINICAL DATA:  History of multiple falls since February, most recent fall 2 days ago. Bilateral shoulder pain, generalized bilateral rib pain. EXAM: BILATERAL RIBS - 3+ VIEW COMPARISON:  None. FINDINGS: Four views of the bilateral ribs are provided. Ribs appear intact and well aligned bilaterally. IMPRESSION: No rib fracture seen bilaterally. Electronically Signed   By: Bary Richard M.D.   On: 08/09/2015 17:58   Dg Shoulder Right  08/09/2015  CLINICAL DATA:  Hx of multiple falls since feburary, most recent fall was 2 days ago when pt fell and pulled dresser over unto herself. Bilateral shoulder pain. Bilateral humerus and forearm pain. Generalized bilateral rib pain. EXAM: RIGHT SHOULDER - 2+  VIEW COMPARISON:  None. FINDINGS: No fracture. No dislocation. Mild AC joint osteoarthritis. Bones are demineralized. Soft tissues are unremarkable. IMPRESSION: No fracture or dislocation. Electronically Signed   By: Amie Portland M.D.   On: 08/09/2015 17:58   Dg Forearm Left  08/09/2015  CLINICAL DATA:  Hx of multiple falls since feburary, most recent fall was 2 days ago when pt fell and pulled dresser over unto herself, pt unable to give coherent history, bilateral shoulder pain, bilateral humerus pain, bilateral forearm pain, EXAM: LEFT FOREARM - 2 VIEW COMPARISON:  None. FINDINGS: Negative for fracture or dislocation. Corticated ossicle near the trochlea. Small spurs  from the coronoid process of the ulna and radial head. Normal mineralization and alignment. IMPRESSION: 1. Negative for fracture. 2. Mild degenerative changes in the elbow as above. Electronically Signed   By: Corlis Leak M.D.   On: 08/09/2015 18:02   Dg Forearm Right  08/09/2015  CLINICAL DATA:  Hx of multiple falls since feburary, most recent fall was 2 days ago when pt fell and pulled dresser over unto herself. Bilateral shoulder pain. Bilateral humerus and forearm pain. Generalized bilateral rib pain. EXAM: RIGHT FOREARM - 2 VIEW COMPARISON:  None. FINDINGS: No convincing acute fracture. Wrist and elbow joints are normally aligned. Bones are demineralized. Soft tissues are unremarkable. IMPRESSION: No acute fracture or dislocation. Electronically Signed   By: Amie Portland M.D.   On: 08/09/2015 18:00   Ct Head Wo Contrast  08/08/2015  CLINICAL DATA:  Fall.  History of Alzheimer's dementia. EXAM: CT HEAD WITHOUT CONTRAST TECHNIQUE: Contiguous axial images were obtained from the base of the skull through the vertex without intravenous contrast. COMPARISON:  None. FINDINGS: There is age related volume loss. There is no intracranial mass, hemorrhage, extra-axial fluid collection, midline shift. There is patchy small vessel disease in the centra semiovale bilaterally. Elsewhere gray-white compartments appear normal. No acute infarct is evident. There is a small right frontal scalp hematoma. Bony calvarium appears intact. The mastoid air cells are clear. IMPRESSION: Age related volume loss with patchy periventricular small vessel disease. No intracranial mass, hemorrhage, or extra-axial fluid collection. No acute infarct evident. Right frontal scalp hematoma without evidence of fracture. Electronically Signed   By: Bretta Bang III M.D.   On: 08/08/2015 15:28   US Abdomen Complete  08/09/2015  CLINICAL DATA:  Diffuse abdominal pain. EXAM: ULTRASOUND ABDOMEN COMPLETE COMPARISON:  None. FINDINGS:  Gallbladder: No gallstones or wall thickening visualized. No sonographic Murphy sign noted. Common bile duct: Diameter: 1.6 mm Liver: Liver normal in size and echogenicity . No mass or focal lesion. Hepatopetal flow documented in the portal vein. IVC: No abnormality visualized. Pancreas: Visualized portion unremarkable. Spleen: Size and appearance within normal limits. Right Kidney: Length: 10.7 cm. Echogenicity within normal limits. No mass or hydronephrosis visualized. Left Kidney: Length: 11.6 cm. Echogenicity within normal limits. No mass or hydronephrosis visualized. Abdominal aorta: No aneurysm visualized. Other findings: Small amount of ascites adjacent to the liver and spleen. IMPRESSION: 1. Small amount of ascites of unclear origin. 2. Normal gallbladder.  No bile duct dilation. Electronically Signed   By: Amie Portland M.D.   On: 08/09/2015 17:03   Ct Abdomen Pelvis W Contrast  08/10/2015  CLINICAL DATA:  64 year old female with 200 pound weight loss over the past year. History of tobacco and alcohol abuse. Multiple recent falls. Abdominal fullness. Evaluate for underlying malignancy. EXAM: CT ABDOMEN AND PELVIS WITH CONTRAST TECHNIQUE: Multidetector CT imaging of the abdomen and pelvis was  performed using the standard protocol following bolus administration of intravenous contrast. CONTRAST:  100mL OMNIPAQUE IOHEXOL 300 MG/ML  SOLN COMPARISON:  No priors. FINDINGS: Lower chest: Atherosclerotic calcifications in the right coronary artery. Hepatobiliary: The liver has a slightly shrunken appearance and nodular contour, suggestive of underlying cirrhosis. A few scattered tiny sub cm low-attenuation lesions are too small to definitively characterize. In addition, there are 3 hypervascular lesions in segments 2, 6 and 7, the largest of which measures 9 mm in segment 7 (image 24 of series 2). Diffuse periportal edema. No intra or extrahepatic biliary ductal dilatation. Gallbladder is nearly completely  decompressed, but otherwise unremarkable in appearance. Pancreas: No pancreatic mass. No pancreatic ductal dilatation. No pancreatic or peripancreatic fluid or inflammatory changes. Spleen: Unremarkable. Adrenals/Urinary Tract: Adreniform thickening of the adrenal glands bilaterally. Unusual appearance of the left kidney which appears partially effaced, with areas of hypoenhancement and poor corticomedullary differentiation, particularly notable on coronal images. Right kidney is normal in appearance. No hydroureteronephrosis. Urinary bladder is normal in appearance. Stomach/Bowel: Normal appearance of the stomach. No pathologic dilatation of small bowel or colon. Appendix is not confidently identified, but there are no overt inflammatory changes adjacent to the cecum to suggest presence of an acute appendicitis at this time. Vascular/Lymphatic: Atherosclerosis throughout the abdominal and pelvic vasculature, without evidence of aneurysm or dissection. No definite lymphadenopathy identified in the abdomen or pelvis (assessment is slightly limited by lack of intra-abdominal fat). Reproductive: Status post hysterectomy. Ovaries are not confidently identified may be surgically absent or atrophic. Regardless, no adnexal mass is identified on today's examination. Other: Trace volume of ascites.  No pneumoperitoneum. Musculoskeletal: 7 mm sclerotic lesion with narrow zone of transition in the left proximal femur is most compatible with a bone island. There are no other aggressive appearing lytic or blastic lesions noted in the visualized portions of the skeleton. IMPRESSION: 1. Unusual appearance of the left kidney, concerning for potential pyelonephritis. Alternatively, a similar appearance could be seen in setting of renal lymphoma. Correlation with urinalysis is recommended. 2. Morphologic changes in the liver suggestive of early cirrhosis. Importantly, there are several indeterminate lesions, 3 of which are  hypervascular. Correlation with nonemergent MRI of the abdomen with and without IV gadolinium (preferably Eovist) is recommended in the near future to exclude the possibility of hepatocellular carcinoma. 3. Small volume of ascites. 4. No adnexal mass identified. Ovaries are not confidently identified and may be surgically absent or atrophic. Patient is status post hysterectomy. 5. Atherosclerosis, including right coronary artery disease. Please note that although the presence of coronary artery calcium documents the presence of coronary artery disease, the severity of this disease and any potential stenosis cannot be assessed on this non-gated CT examination. Assessment for potential risk factor modification, dietary therapy or pharmacologic therapy may be warranted, if clinically indicated. 6. Additional incidental findings, as above. Electronically Signed   By: Trudie Reedaniel  Entrikin M.D.   On: 08/10/2015 19:58   Dg Shoulder Left  08/09/2015  CLINICAL DATA:  Multiple falls, bilateral shoulder pain. EXAM: LEFT SHOULDER - 2+ VIEW COMPARISON:  None. FINDINGS: Two views of the left shoulder are provided. Left humeral head appears well positioned relative to the glenoid fossa. No fracture line or displaced fracture fragment seen. Mild degenerative change noted at the [facility]. Soft tissues about the left shoulder are unremarkable. IMPRESSION: No acute findings. No fracture or dislocation seen. Mild degenerative change at the [facility]. Electronically Signed   By: Anne NgStan  Maynard M.D.  On: 08/09/2015 17:59   Dg Humerus Left  08/09/2015  CLINICAL DATA:  Hx of multiple falls since feburary, most recent fall was 2 days ago when pt fell and pulled dresser over unto herself. Bilateral shoulder pain. Bilateral humerus and forearm pain. Generalized bilateral rib pain. EXAM: LEFT HUMERUS - 2+ VIEW COMPARISON:  None. FINDINGS: No fracture. No bone lesion. Shoulder and elbow joints are normally aligned. Soft tissues are  unremarkable. Bones are demineralized. IMPRESSION: No fracture or dislocation. Electronically Signed   By: Amie Portland M.D.   On: 08/09/2015 17:59   Dg Humerus Right  08/09/2015  CLINICAL DATA:  Hx of multiple falls since feburary, most recent fall was 2 days ago when pt fell and pulled dresser over unto herself. Bilateral shoulder pain. Bilateral humerus and forearm pain. Generalized bilateral rib pain. EXAM: RIGHT HUMERUS - 2+ VIEW COMPARISON:  None. FINDINGS: No fracture. No bone lesion. The shoulder and elbow joints are normally aligned. Soft tissues are unremarkable. Bones are demineralized. IMPRESSION: No fracture or dislocation. Electronically Signed   By: Amie Portland M.D.   On: 08/09/2015 17:59   Admission HPI:  Ms. Carla Marsh is a 64 year old lady with a history of hypertension, seizure disorder, 50 pack-year smoking history, and extensive alcohol abuse history presenting with increasing falls and somnolence. The history was provided by her sister as the patient herself is not cogent.  Her sister said she moved from [place] to [place], [place] about 3 years ago to take care of her mother, who has severe Alzheimer's dementia. During that time, her health has declined rapidly, and she's lost about 200 pounds in the last year. She's become more and more demented during that time, but she can feed herself by microwaving, bathe herself, and function quite well with the help from her mother's caretaker.  Two weeks ago, she was hospitalized in [place], [place], and told she had seizures after getting an EEG. She was started on Keppra at that time and has been falling more since then. Today she fell backwards with the dresser and it fell on top of her, but she says she didn't injure herself, and she declines any complaints whatsoever, specifically including night sweats, chills, headache, cough, shortness of breath, dysuria, or diarrhea. She says her sister made her come in, and she is what's bothering her the  most.  Her last mammogram was last year and reportedly normal, and her last colonoscopy was 10 years ago and was also reportedly normal. Besides alcohol and tobacco, there's no other drug use the sister is aware of. She continue to smoke but quit drinking back in February after a previous hospitalization. No cancer runs in the family.  In the emergency department, she was afebrile and her vital signs were normal. Labs were notable for a leukocytosis of 17, BMP notable for BUN 33, creatinine 1.81. Urinalysis showed many bacteria and leukocytes, but negative nitrites. Chest x-ray was normal and head CT showed age-related volume loss and a right frontal scalp hematoma, without evidence of an acute bleed or fracture.  Hospital Course by problem list:  1. E coli bacteremia secondary to pyelonephritis: She presented with somnolence and increased confusion over the past two weeks and was found to have E coli bacteremia, urine cultures grew E coli, and pyelonephritis was noted on her abdominal CT without abscess. She remained afebrile and did not appear to be clinically septic throughout her stay, and her mental status improved significantly with antibiotics. She was originally started on  Zosyn for Pseudomonal coverage but this was changed to Ceftriaxone after cultures came back positive for E coli. Her mental status improved considerably over three days. Her urine cultures grew E coli resistant to Bactrim but sensitive to Ciprofloxacin, so she was discharged on Cirpofloxacin to be stopped on November 8.  2. 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 She has an extensive drinking history. We obtained a  hepatitis B and C panel that were negative and an AFP was normal 2.3.  3. Seizure disorder: She did not have any seizures. Her Keppra 250mg  BID was continued during her stay.  4. Hypertension: She was normotensive her entire hospital stay despite being aggressively hydrated. Amlodipine and Lisinopril were discontinued upon discharge.  5. Alzheimer's dementia: She was very somnolent one morning and became much more alert and conversational after stopping risperidone, so we stopped this but she was sundowning one evening so we resumed her on Risperidone 1mg  QHS.  Discharge Vitals:   BP 116/71 mmHg  Pulse 83  Temp(Src) 98.6 F (37 C) (Oral)  Resp 18  Ht 6\' 1"  (1.854 m)  Wt 51.574 kg (113 lb 11.2 oz)  BMI 15.00 kg/m2  SpO2 99%  Discharge Labs:  Results for orders placed or performed during the hospital encounter of 08/08/15 (from the past 24 hour(s))  Culture, blood (routine x 2)     Status: None (Preliminary result)   Collection Time: 08/11/15  2:45 PM  Result Value Ref Range   Specimen Description BLOOD RIGHT ANTECUBITAL    Special Requests BOTTLES DRAWN AEROBIC AND ANAEROBIC 5CC    Culture PENDING    Report Status PENDING   Culture, blood (routine x 2)     Status: None (Preliminary result)   Collection Time: 08/11/15  2:50 PM  Result Value Ref Range   Specimen Description BLOOD LEFT ANTECUBITAL    Special Requests BOTTLES DRAWN AEROBIC AND ANAEROBIC 5CC    Culture PENDING    Report Status PENDING     Signed: Selina Cooley, MD 08/12/2015, 12:28 PM

## 2015-08-10 ENCOUNTER — Inpatient Hospital Stay (HOSPITAL_COMMUNITY): Payer: Federal, State, Local not specified - PPO

## 2015-08-10 DIAGNOSIS — N179 Acute kidney failure, unspecified: Secondary | ICD-10-CM

## 2015-08-10 DIAGNOSIS — D72829 Elevated white blood cell count, unspecified: Secondary | ICD-10-CM

## 2015-08-10 LAB — CBC
HEMATOCRIT: 31.6 % — AB (ref 36.0–46.0)
HEMOGLOBIN: 10.3 g/dL — AB (ref 12.0–15.0)
MCH: 27 pg (ref 26.0–34.0)
MCHC: 32.6 g/dL (ref 30.0–36.0)
MCV: 82.7 fL (ref 78.0–100.0)
Platelets: 211 10*3/uL (ref 150–400)
RBC: 3.82 MIL/uL — AB (ref 3.87–5.11)
RDW: 15.1 % (ref 11.5–15.5)
WBC: 13.9 10*3/uL — ABNORMAL HIGH (ref 4.0–10.5)

## 2015-08-10 LAB — BASIC METABOLIC PANEL
ANION GAP: 10 (ref 5–15)
BUN: 37 mg/dL — ABNORMAL HIGH (ref 6–20)
CHLORIDE: 109 mmol/L (ref 101–111)
CO2: 20 mmol/L — AB (ref 22–32)
Calcium: 8.8 mg/dL — ABNORMAL LOW (ref 8.9–10.3)
Creatinine, Ser: 1.31 mg/dL — ABNORMAL HIGH (ref 0.44–1.00)
GFR calc non Af Amer: 43 mL/min — ABNORMAL LOW (ref >60)
GFR, EST AFRICAN AMERICAN: 49 mL/min — AB (ref >60)
Glucose, Bld: 136 mg/dL — ABNORMAL HIGH (ref 65–99)
POTASSIUM: 4.4 mmol/L (ref 3.5–5.1)
SODIUM: 139 mmol/L (ref 135–145)

## 2015-08-10 LAB — SEDIMENTATION RATE: SED RATE: 84 mm/h — AB (ref 0–22)

## 2015-08-10 LAB — OCCULT BLOOD X 1 CARD TO LAB, STOOL: FECAL OCCULT BLD: NEGATIVE

## 2015-08-10 LAB — LEVETIRACETAM LEVEL: Levetiracetam Lvl: 9.3 ug/mL — ABNORMAL LOW (ref 10.0–40.0)

## 2015-08-10 MED ORDER — IOHEXOL 300 MG/ML  SOLN
100.0000 mL | Freq: Once | INTRAMUSCULAR | Status: AC | PRN
  Administered 2015-08-10: 100 mL via INTRAVENOUS

## 2015-08-10 MED ORDER — IOHEXOL 300 MG/ML  SOLN
25.0000 mL | INTRAMUSCULAR | Status: AC
  Administered 2015-08-10: 25 mL via ORAL

## 2015-08-10 MED ORDER — SODIUM CHLORIDE 0.9 % IV BOLUS (SEPSIS)
1000.0000 mL | Freq: Once | INTRAVENOUS | Status: AC
  Administered 2015-08-10: 1000 mL via INTRAVENOUS

## 2015-08-10 MED ORDER — DEXTROSE 5 % IV SOLN
2.0000 g | INTRAVENOUS | Status: DC
  Administered 2015-08-10 – 2015-08-11 (×2): 2 g via INTRAVENOUS
  Filled 2015-08-10 (×2): qty 2

## 2015-08-10 NOTE — Progress Notes (Signed)
ANTIBIOTIC CONSULT NOTE   Pharmacy Consult for Zosyn>>ceftriaxone Indication: sepsis  No Known Allergies  Patient Measurements: Height: 6\' 1"  (185.4 cm) Weight: 113 lb 11.2 oz (51.574 kg) IBW/kg (Calculated) : 75.4 Adjusted Body Weight:   Vital Signs: Temp: 97.9 F (36.6 C) (10/26 0924) Temp Source: Oral (10/26 0924) BP: 162/81 mmHg (10/26 0924) Pulse Rate: 89 (10/26 0924) Intake/Output from previous day:   Intake/Output from this shift:    Labs:  Recent Labs  08/08/15 1257 08/09/15 0444 08/10/15 0215  WBC 17.3* 19.0* 13.9*  HGB 11.5* 10.3* 10.3*  PLT 192 197 211  CREATININE 1.81* 1.48* 1.31*   Est CrCl ~44 ml/min  Estimated Creatinine Clearance: 36.3 mL/min (by C-G formula based on Cr of 1.31). No results for input(s): VANCOTROUGH, VANCOPEAK, VANCORANDOM, GENTTROUGH, GENTPEAK, GENTRANDOM, TOBRATROUGH, TOBRAPEAK, TOBRARND, AMIKACINPEAK, AMIKACINTROU, AMIKACIN in the last 72 hours.   Microbiology: Recent Results (from the past 720 hour(s))  Culture, Urine     Status: None (Preliminary result)   Collection Time: 08/08/15  3:00 PM  Result Value Ref Range Status   Specimen Description Urine  Final   Special Requests NONE  Final   Culture >=100,000 COLONIES/mL ESCHERICHIA COLI  Final   Report Status PENDING  Incomplete  Culture, blood (routine x 2)     Status: None (Preliminary result)   Collection Time: 08/08/15  4:41 PM  Result Value Ref Range Status   Specimen Description BLOOD RIGHT FOREARM  Final   Special Requests IN PEDIATRIC BOTTLE 3CC  Final   Culture  Setup Time   Final    GRAM NEGATIVE RODS AEROBIC BOTTLE ONLY CRITICAL RESULT CALLED TO, READ BACK BY AND VERIFIED WITH: A THOMPSON 0627 08/09/15 MKELLY    Culture ESCHERICHIA COLI  Final   Report Status PENDING  Incomplete  Culture, blood (routine x 2)     Status: None (Preliminary result)   Collection Time: 08/08/15  4:46 PM  Result Value Ref Range Status   Specimen Description BLOOD RIGHT ARM   Final   Special Requests BOTTLES DRAWN AEROBIC AND ANAEROBIC 5CC  Final   Culture  Setup Time   Final    GRAM NEGATIVE RODS IN BOTH AEROBIC AND ANAEROBIC BOTTLES CRITICAL RESULT CALLED TO, READ BACK BY AND VERIFIED WITH: A THOMPSON,RN [id number] WILDERK    Culture ESCHERICHIA COLI  Final   Report Status PENDING  Incomplete    Medical History: Past Medical History  Diagnosis Date  . Hypertension   . Seizures (HCC)   . Alzheimer's dementia    Assessment: 64 y/o female who presented with increasing falls and somnolence. She was started on ceftriaxone for a UTI however blood cultures are now reporting 2/2 with GNR. Pharmacy consulted to change to Zosyn for possible sepsis.   Currently afebrile, wbc down to 13.9 and scr trending down to 1.3.   Ceftriaxone 1g x1 on 10/24, 10/26>> Zosyn 10/25>>10/26 10/24 UCx - ecoli 10/24 BCx2 - ecoli  Goal of Therapy:  Eradication of infection  Plan:  - Change back to ceftriaxone 2g q24 hours given bacteremia - follow up sensitivities  Sheppard CoilFrank Dakayla Disanti PharmD., BCPS Clinical Pharmacist Pager [phone] 08/10/2015 11:56 AM

## 2015-08-10 NOTE — Progress Notes (Signed)
Pt currently drinking contrast for abdominal CT. CT states delay due to reorder of contrast. Pt scheduled to have CT at 1845. Lawson RadarHeather M Kristian Mogg

## 2015-08-10 NOTE — Progress Notes (Signed)
Patient ID: Carla Marsh, female   DOB: 29-May-1951, 64 y.o.   MRN: [id number]   Subjective: Carla Marsh was much more alert and conversational this morning, and was sitting upright and eating her breakfast. She denies any complaints at all and says she's feeling quite well. She still thinks she's in [place] but understood when I explained to her she's in [place] because her sister lives down in [place]. She's not complaining of any abdominal pain, back pain, pain in her arms, or dysuria.  Objective: Vital signs in last 24 hours: Filed Vitals:   08/09/15 1832 08/09/15 2125 08/10/15 0116 08/10/15 0605  BP: 87/65 104/61 138/88 131/75  Pulse: 77 78 88 83  Temp: 98.2 F (36.8 C) 97.9 F (36.6 C) 98.6 F (37 C) 97.8 F (36.6 C)  TempSrc: Oral Oral Axillary Oral  Resp: Height:      Weight:      SpO2: 98% 99% 97% 99%   General: cachectic appearing African-American lady, much more alert today HEENT: no scleral icterus, extra-ocular muscles intact, oropharynx without lesions Cardiac: newly irregular rhythm, no rubs, murmurs or gallops Pulm: breathing well, clear to auscultation bilaterally Abd: bowel sounds normal, soft, nondistended, non-tender Ext: warm and well perfused, without pedal edema. Extremities non-tender to palpation Lymph: no cervical, axillary, or supraclavicular lymphadenopathy Skin: no rash, hair, or nail changes Neuro: alert and oriented X3, cranial nerves II-XII grossly intact, strength 5/5 throughout, non-focal    Lab Results: Basic Metabolic Panel:  Recent Labs Lab 08/09/15 0444 08/10/15 0215  NA 134* 139  K 3.8 4.4  CL 106 109  CO2 19* 20*  GLUCOSE 98 136*  BUN 33* 37*  CREATININE 1.48* 1.31*  CALCIUM 8.4* 8.8*   Liver Function Tests:  Recent Labs Lab 08/08/15 1257 08/09/15 0444  AST 33 27  ALT 14 16  ALKPHOS 78 81  BILITOT 1.1 1.0  PROT 7.5 6.2*  ALBUMIN 3.0* 2.3*   CBC:  Recent Labs Lab 08/09/15 0444 08/10/15 0215    WBC 19.0* 13.9*  NEUTROABS 16.1*  --   HGB 10.3* 10.3*  HCT 30.4* 31.6*  MCV 82.4 82.7  PLT 197 211   Micro Results: Recent Results (from the past 240 hour(s))  Culture, Urine     Status: None (Preliminary result)   Collection Time: 08/08/15  3:00 PM  Result Value Ref Range Status   Specimen Description Urine  Final   Special Requests NONE  Final   Culture TOO YOUNG TO READ  Final   Report Status PENDING  Incomplete  Culture, blood (routine x 2)     Status: None (Preliminary result)   Collection Time: 08/08/15  4:41 PM  Result Value Ref Range Status   Specimen Description BLOOD RIGHT FOREARM  Final   Special Requests IN PEDIATRIC BOTTLE 3CC  Final   Culture  Setup Time   Final    GRAM NEGATIVE RODS AEROBIC BOTTLE ONLY CRITICAL RESULT CALLED TO, READ BACK BY AND VERIFIED WITH: A THOMPSON 0627 08/09/15 MKELLY    Culture ESCHERICHIA COLI  Final   Report Status PENDING  Incomplete  Culture, blood (routine x 2)     Status: None (Preliminary result)   Collection Time: 08/08/15  4:46 PM  Result Value Ref Range Status   Specimen Description BLOOD RIGHT ARM  Final   Special Requests BOTTLES DRAWN AEROBIC AND ANAEROBIC 5CC  Final   Culture  Setup Time   Final    GRAM NEGATIVE RODS IN  BOTH AEROBIC AND ANAEROBIC BOTTLES CRITICAL RESULT CALLED TO, READ BACK BY AND VERIFIED WITH: A THOMPSON,RN [id number] [facility]ILDERK    Culture ESCHERICHIA COLI  Final   Report Status PENDING  Incomplete   Studies/Results:Dg Ribs Bilateral  08/09/2015  CLINICAL DATA:  History of multiple falls since February, most recent fall 2 days ago. Bilateral shoulder pain, generalized bilateral rib pain. EXAM: BILATERAL RIBS - 3+ VIEW COMPARISON:  None. FINDINGS: Four views of the bilateral ribs are provided. Ribs appear intact and well aligned bilaterally. IMPRESSION: No rib fracture seen bilaterally. Electronically Signed   By: Bary RichardStan  Maynard M.D.   On: 08/09/2015 17:58   Dg Shoulder Right  08/09/2015  CLINICAL  DATA:  Hx of multiple falls since feburary, most recent fall was 2 days ago when pt fell and pulled dresser over unto herself. Bilateral shoulder pain. Bilateral humerus and forearm pain. Generalized bilateral rib pain. EXAM: RIGHT SHOULDER - 2+ VIEW COMPARISON:  None. FINDINGS: No fracture. No dislocation. Mild AC joint osteoarthritis. Bones are demineralized. Soft tissues are unremarkable. IMPRESSION: No fracture or dislocation. Electronically Signed   By: Amie Portlandavid  Ormond M.D.   On: 08/09/2015 17:58   Dg Forearm Left  08/09/2015  CLINICAL DATA:  Hx of multiple falls since feburary, most recent fall was 2 days ago when pt fell and pulled dresser over unto herself, pt unable to give coherent history, bilateral shoulder pain, bilateral humerus pain, bilateral forearm pain, EXAM: LEFT FOREARM - 2 VIEW COMPARISON:  None. FINDINGS: Negative for fracture or dislocation. Corticated ossicle near the trochlea. Small spurs from the coronoid process of the ulna and radial head. Normal mineralization and alignment. IMPRESSION: 1. Negative for fracture. 2. Mild degenerative changes in the elbow as above. Electronically Signed   By: Corlis Leak  Hassell M.D.   On: 08/09/2015 18:02   Dg Forearm Right  08/09/2015  CLINICAL DATA:  Hx of multiple falls since feburary, most recent fall was 2 days ago when pt fell and pulled dresser over unto herself. Bilateral shoulder pain. Bilateral humerus and forearm pain. Generalized bilateral rib pain. EXAM: RIGHT FOREARM - 2 VIEW COMPARISON:  None. FINDINGS: No convincing acute fracture. Wrist and elbow joints are normally aligned. Bones are demineralized. Soft tissues are unremarkable. IMPRESSION: No acute fracture or dislocation. Electronically Signed   By: Amie Portlandavid  Ormond M.D.   On: 08/09/2015 18:00   [place] Abdomen Complete  08/09/2015  CLINICAL DATA:  Diffuse abdominal pain. EXAM: ULTRASOUND ABDOMEN COMPLETE COMPARISON:  None. FINDINGS: Gallbladder: No gallstones or wall thickening visualized.  No sonographic Murphy sign noted. Common bile duct: Diameter: 1.6 mm Liver: Liver normal in size and echogenicity . No mass or focal lesion. Hepatopetal flow documented in the portal vein. IVC: No abnormality visualized. Pancreas: Visualized portion unremarkable. Spleen: Size and appearance within normal limits. Right Kidney: Length: 10.7 cm. Echogenicity within normal limits. No mass or hydronephrosis visualized. Left Kidney: Length: 11.6 cm. Echogenicity within normal limits. No mass or hydronephrosis visualized. Abdominal aorta: No aneurysm visualized. Other findings: Small amount of ascites adjacent to the liver and spleen. IMPRESSION: 1. Small amount of ascites of unclear origin. 2. Normal gallbladder.  No bile duct dilation. Electronically Signed   By: Amie Portlandavid  Ormond M.D.   On: 08/09/2015 17:03   Dg Shoulder Left  08/09/2015  CLINICAL DATA:  Multiple falls, bilateral shoulder pain. EXAM: LEFT SHOULDER - 2+ VIEW COMPARISON:  None. FINDINGS: Two views of the left shoulder are provided. Left humeral head appears well positioned relative to the  glenoid fossa. No fracture line or displaced fracture fragment seen. Mild degenerative change noted at the left [facility] joint. Soft tissues about the left shoulder are unremarkable. IMPRESSION: No acute findings. No fracture or dislocation seen. Mild degenerative change at the left [facility] joint. Electronically Signed   By: Bary Richard M.D.   On: 08/09/2015 17:59   Dg Humerus Left  08/09/2015  CLINICAL DATA:  Hx of multiple falls since feburary, most recent fall was 2 days ago when pt fell and pulled dresser over unto herself. Bilateral shoulder pain. Bilateral humerus and forearm pain. Generalized bilateral rib pain. EXAM: LEFT HUMERUS - 2+ VIEW COMPARISON:  None. FINDINGS: No fracture. No bone lesion. Shoulder and elbow joints are normally aligned. Soft tissues are unremarkable. Bones are demineralized. IMPRESSION: No fracture or dislocation. Electronically Signed   By:  Amie Portland M.D.   On: 08/09/2015 17:59   Dg Humerus Right  08/09/2015  CLINICAL DATA:  Hx of multiple falls since feburary, most recent fall was 2 days ago when pt fell and pulled dresser over unto herself. Bilateral shoulder pain. Bilateral humerus and forearm pain. Generalized bilateral rib pain. EXAM: RIGHT HUMERUS - 2+ VIEW COMPARISON:  None. FINDINGS: No fracture. No bone lesion. The shoulder and elbow joints are normally aligned. Soft tissues are unremarkable. Bones are demineralized. IMPRESSION: No fracture or dislocation. Electronically Signed   By: Amie Portland M.D.   On: 08/09/2015 17:59   Medications: I have reviewed the patient's current medications. Scheduled Meds: . aspirin EC  81 mg Oral Daily  . enoxaparin (LOVENOX) injection  40 mg Subcutaneous Q24H  . feeding supplement (ENSURE ENLIVE)  237 mL Oral TID BM  . feeding supplement (PRO-STAT SUGAR FREE 64)  30 mL Oral BID  . folic acid  1 mg Oral Daily  . levETIRAcetam  250 mg Oral BID  . multivitamin with minerals  1 tablet Oral Daily  . piperacillin-tazobactam (ZOSYN)  IV  3.375 g Intravenous Q8H  . senna  1 tablet Oral BID  . thiamine  100 mg Oral Daily   Continuous Infusions: . sodium chloride 125 mL/hr at 08/09/15 2035   PRN Meds:.acetaminophen **OR** acetaminophen, ondansetron **OR** ondansetron (ZOFRAN) IV   Assessment/Plan:  Ms. Zmuda was much more alert today since holding her risperidone, without any complaints, but she's still quite confused, and is telling me that her belly feels a tad bloated. Her abdominal ultrasound showed minimal ascites to we'll look for malignancy given her 200lb weight loss with an abdominal CT with contrast. She still has a slight AKI so we'll give fluids before and after contrast. We'll check her FOBT as well. All of her bony x-rays were negative for fracture. Her blood cultures grew E coli so we'll switch her Zosyn to Ceftriaxone. She doesn't appear clinically septic and her  leukocytosis is improving.  E coli bacteremia: Per above, not clinically septic. -Changed Zosyn to Ceftriaxone -Follow sensitivities -NS at 125cc/hr  Urinary tract infection: Per above. -Follow urine cultures  200 pound weight loss in one year: Differential includes malignancy versus progressive dementia and malnutrition. Per above, we'll get an abdominal CT today. -CT abdomen/pelvic today with fluids pre and post-contrast -Nutrition consult  Hypertension: She is normotensive right now. -Holding amiloride 5mg  daily -Holding lisinopril 40mg  daily  Seizure disorder: She is not actively seizing. -Continue levetiracetam 250mg  twice daily-  Alzheimer's dementia: She has baseline dementia and lives with her mom in [place], who also has dementia. -Holding risperidone 2mg  twice daily -Holding  B12 as she has supratherapeutic level  Dispo: Disposition is deferred at this time, awaiting improvement of current medical problems.  The patient does not know have a current PCP (No primary care provider on file.) and does need an [facility] hospital follow-up appointment after discharge.  The patient does have transportation limitations that hinder transportation to clinic appointments.  .Services Needed at time of discharge: Y = Yes, Blank = No PT:   OT:   RN:   Equipment:   Other:     LOS: 2 days   Selina Cooley, MD 08/10/2015, 9:07 AM

## 2015-08-10 NOTE — Clinical Social Work Note (Signed)
CSW received consult for patient to go to SNF for short term rehab.  CSW met with patient, and she requested that CSW contact her sister Carla Marsh to discuss SNF placement option and process.  CSW to follow up with patient's sister on Thursday.  Carla Marsh. Carla Marsh, MSW, [place] 08/10/2015 6:19 PM

## 2015-08-11 DIAGNOSIS — G309 Alzheimer's disease, unspecified: Secondary | ICD-10-CM

## 2015-08-11 DIAGNOSIS — K7460 Unspecified cirrhosis of liver: Secondary | ICD-10-CM

## 2015-08-11 DIAGNOSIS — R634 Abnormal weight loss: Secondary | ICD-10-CM

## 2015-08-11 DIAGNOSIS — K769 Liver disease, unspecified: Secondary | ICD-10-CM

## 2015-08-11 DIAGNOSIS — I10 Essential (primary) hypertension: Secondary | ICD-10-CM

## 2015-08-11 DIAGNOSIS — G40909 Epilepsy, unspecified, not intractable, without status epilepticus: Secondary | ICD-10-CM

## 2015-08-11 DIAGNOSIS — F0280 Dementia in other diseases classified elsewhere without behavioral disturbance: Secondary | ICD-10-CM

## 2015-08-11 DIAGNOSIS — N12 Tubulo-interstitial nephritis, not specified as acute or chronic: Secondary | ICD-10-CM | POA: Insufficient documentation

## 2015-08-11 DIAGNOSIS — B9620 Unspecified Escherichia coli [E. coli] as the cause of diseases classified elsewhere: Secondary | ICD-10-CM

## 2015-08-11 LAB — BASIC METABOLIC PANEL
ANION GAP: 5 (ref 5–15)
BUN: 28 mg/dL — ABNORMAL HIGH (ref 6–20)
CHLORIDE: 110 mmol/L (ref 101–111)
CO2: 23 mmol/L (ref 22–32)
CREATININE: 1.06 mg/dL — AB (ref 0.44–1.00)
Calcium: 8.3 mg/dL — ABNORMAL LOW (ref 8.9–10.3)
GFR calc non Af Amer: 55 mL/min — ABNORMAL LOW (ref >60)
Glucose, Bld: 109 mg/dL — ABNORMAL HIGH (ref 65–99)
Potassium: 4.2 mmol/L (ref 3.5–5.1)
Sodium: 138 mmol/L (ref 135–145)

## 2015-08-11 LAB — CBC
HEMATOCRIT: 29.6 % — AB (ref 36.0–46.0)
Hemoglobin: 9.7 g/dL — ABNORMAL LOW (ref 12.0–15.0)
MCH: 26.9 pg (ref 26.0–34.0)
MCHC: 32.8 g/dL (ref 30.0–36.0)
MCV: 82.2 fL (ref 78.0–100.0)
PLATELETS: 225 10*3/uL (ref 150–400)
RBC: 3.6 MIL/uL — ABNORMAL LOW (ref 3.87–5.11)
RDW: 15.2 % (ref 11.5–15.5)
WBC: 11.1 10*3/uL — ABNORMAL HIGH (ref 4.0–10.5)

## 2015-08-11 LAB — URINE CULTURE

## 2015-08-11 NOTE — Clinical Social Work Note (Signed)
CSW spoke to patient's sister Delray AltMargie to discuss SNF placement for patient.  Patient's sister agreed to begin SNF search process [place] SN[place] in [place].  CSW explained to patient's sister SNF placement process and also informed her that insurance has to approve patient's stay.  Patient and sister are in agreement to looking for SNF for short term rehab.  CSW to continue SNF search process for placement.  Ervin KnackEric R. Lupie Sawa, MSW, Theresia MajorsLCSWA [phone] 08/11/2015 1:26 PM

## 2015-08-11 NOTE — NC FL2 (Signed)
[place] MEDICAID FL2 LEVEL OF CARE SCREENING TOOL     IDENTIFICATION  Patient Name: Carla Marsh Birthdate: [birth year] Sex: female Admission Date (Current Location): 08/08/2015  [place]   Facility and Address:  [facility], [address], [place], [place] [address]      Provider Number: [id number]  Attending Physician Name and Address:  Inez CatalinaEmily B Mullen, MD  Relative Name and Phone Number:  Gregery NaMargie Emore [phone]    Current Level of Care: Hospital Recommended Level of Care: Skilled Nursing Facility Prior Approval Number:    Date Approved/Denied:   PASRR Number:    Discharge Plan: SNF    Current Diagnoses: Patient Active Problem List   Diagnosis Date Noted  . Pyelonephritis   . Protein-calorie malnutrition, severe 08/09/2015  . Confusion 08/08/2015  . UTI (lower urinary tract infection) 08/08/2015  . AKI (acute kidney injury) (HCC) 08/08/2015    Orientation ACTIVITIES/SOCIAL BLADDER RESPIRATION    Self, Time    Incontinent Normal  BEHAVIORAL SYMPTOMS/MOOD NEUROLOGICAL BOWEL NUTRITION STATUS      Continent Diet (Cardiac)  PHYSICIAN VISITS COMMUNICATION OF NEEDS Height & Weight Skin    Verbally 6\' 1"  (185.4 cm) 113 lbs. Normal          AMBULATORY STATUS RESPIRATION    Supervision limited Normal      Personal Care Assistance Level of Assistance  Dressing, Bathing Bathing Assistance: Limited assistance   Dressing Assistance: Limited assistance      Functional Limitations Info                SPECIAL CARE FACTORS FREQUENCY                      Additional Factors Info                  Current Medications (08/11/2015): Current Facility-Administered Medications  Medication Dose Route Frequency Provider Last Rate Last Dose  . 0.9 %  sodium chloride infusion   Intravenous Continuous Selina CooleyKyle Flores, MD 50 mL/hr at 08/11/15 1326    . acetaminophen (TYLENOL) tablet 650  mg  650 mg Oral Q6H PRN Alexa Dulcy FannyM Richardson, MD       Or  . acetaminophen (TYLENOL) suppository 650 mg  650 mg Rectal Q6H PRN Alexa Dulcy FannyM Richardson, MD      . aspirin EC tablet 81 mg  81 mg Oral Daily Alexa Dulcy FannyM Richardson, MD   81 mg at 08/11/15 1029  . cefTRIAXone (ROCEPHIN) 2 g in dextrose 5 % 50 mL IVPB  2 g Intravenous Q24H Earnie LarssonFrank R Wilson, RPH   2 g at 08/10/15 1745  . enoxaparin (LOVENOX) injection 40 mg  40 mg Subcutaneous Q24H Alexa Dulcy FannyM Richardson, MD   40 mg at 08/10/15 2137  . feeding supplement (ENSURE ENLIVE) (ENSURE ENLIVE) liquid 237 mL  237 mL Oral TID BM Reanne J Barbato, RD   237 mL at 08/11/15 1029  . feeding supplement (PRO-STAT SUGAR FREE 64) liquid 30 mL  30 mL Oral BID Reanne J Barbato, RD   30 mL at 08/11/15 1029  . folic acid (FOLVITE) tablet 1 mg  1 mg Oral Daily Alexa Dulcy FannyM Richardson, MD   1 mg at 08/11/15 1028  . levETIRAcetam (KEPPRA) tablet 250 mg  250 mg Oral BID Alexa Dulcy FannyM Richardson, MD   250 mg at 08/11/15 1028  . multivitamin with minerals tablet 1 tablet  1 tablet Oral Daily Alexa Dulcy FannyM Richardson,  MD   1 tablet at 08/11/15 1029  . ondansetron (ZOFRAN) tablet 4 mg  4 mg Oral Q6H PRN Alexa Dulcy Fanny, MD       Or  . ondansetron [facility]) injection 4 mg  4 mg Intravenous Q6H PRN Alexa Dulcy Fanny, MD      . senna (SENOKOT) tablet 8.6 mg  1 tablet Oral BID Cecile Sheerer, FNP   8.6 mg at 08/11/15 1029  . thiamine (VITAMIN B-1) tablet 100 mg  100 mg Oral Daily Alexa Dulcy Fanny, MD   100 mg at 08/11/15 1028   Do not use this list as official medication orders. Please verify with discharge summary.  Discharge Medications:   Medication List    ASK your doctor about these medications        aMILoride 5 MG tablet  Commonly known as:  MIDAMOR  Take 5 mg by mouth daily.     aspirin EC 81 MG tablet  Take 81 mg by mouth daily.     levETIRAcetam 250 MG tablet  Commonly known as:  KEPPRA  Take 250 mg by mouth 2 (two) times daily.     lisinopril 40 MG tablet  Commonly  known as:  PRINIVIL,ZESTRIL  Take 40 mg by mouth daily.     multivitamin with minerals tablet  Take 1 tablet by mouth daily.     MYRBETRIQ 25 MG Tb24 tablet  Generic drug:  mirabegron ER  Take 25 mg by mouth daily.     niacin 250 MG tablet  Take 250 mg by mouth at bedtime.     Potassium 99 MG Tabs  Take 99 mg by mouth daily.     risperiDONE 2 MG tablet  Commonly known as:  RISPERDAL  Take 2 mg by mouth 2 (two) times daily.     VITAMIN B 12 PO  Take 2,000 mcg by mouth daily.        Relevant Imaging Results:  Relevant Lab Results:  Recent Labs    Additional Information No Known Allergies  Vyla Pint, Ervin Knack, LCSWA

## 2015-08-11 NOTE — Progress Notes (Signed)
Physical Therapy Treatment Patient Details Name: Carla Marsh Legere MRN: [id number] DOB: 1951/09/27 Today's Date: 08/11/2015    History of Present Illness Adm 10/24 with recent falls and confusion; + UTI; workup to rule out metastasis (significant unintentional weight loss) PMHx-200 lb weight loss past year; progressive dementia, seizures, tobacco and ETOH abuse    PT Comments    Patient much more alert and cooperative. Decr balance when walking with head turns or cognitive tasks (counting rooms) up to mod assist to remain upright.   Follow Up Recommendations  SNF     Equipment Recommendations  Other (comment) (TBA)    Recommendations for Other Services OT consult;Speech consult     Precautions / Restrictions Precautions Precautions: Fall    Mobility  Bed Mobility               General bed mobility comments: up in bathroom with assist on arrival  Transfers Overall transfer level: Needs assistance Equipment used: None Transfers: Sit to/from Stand Sit to Stand: Min assist         General transfer comment: posterior bias with slight stagger step  Ambulation/Gait Ambulation/Gait assistance: Mod assist Ambulation Distance (Feet): 300 Feet Assistive device: None Gait Pattern/deviations: Step-through pattern;Decreased stride length;Staggering right;Drifts right/left;Narrow base of support Gait velocity: slow   General Gait Details: as initially attempted head turn while exiting room, pt with significant LOB to her Rt requiring mod assist to prevent fall x 2; as ambulation progressed she became less unsteady; can minimally incr velocity;   Stairs            Wheelchair Mobility    Modified Rankin (Stroke Patients Only)       Balance     Sitting balance-Leahy Scale: Fair       Standing balance-Leahy Scale: Poor                      Cognition Arousal/Alertness: Awake/alert Behavior During Therapy: Flat affect (brighter) Overall  Cognitive Status: No family/caregiver present to determine baseline cognitive functioning (only oriented to self)       Memory: Decreased short-term memory (can't recall info beyond 60 seconds)              Exercises      General Comments        Pertinent Vitals/Pain Pain Assessment: No/denies pain    Home Living                      Prior Function            PT Goals (current goals can now be found in the care plan section) Acute Rehab PT Goals Patient Stated Goal: get out of here Time For Goal Achievement: 08/16/15 Progress towards PT goals: Progressing toward goals    Frequency  Min 2X/week    PT Plan Current plan remains appropriate    Co-evaluation             End of Session Equipment Utilized During Treatment: Gait belt Activity Tolerance: Patient tolerated treatment well Patient left: with call bell/phone within reach;in chair;with nursing/sitter in room     Time: 1059-1120 PT Time Calculation (min) (ACUTE ONLY): 21 min  Charges:  $Gait Training: 8-22 mins                    G Codes:      Marilynn Ekstein 08/11/2015, 11:30 AM Pager [phone]

## 2015-08-11 NOTE — Progress Notes (Signed)
Patient ID: Carla Marsh, female   DOB: 1951-01-02, 64 y.o.   MRN: [id number]   Subjective: Carla Marsh was much more lively this morning. She originally thought she was in [place] but she quickly remembered how she got to the hospital once we reminded her. Per her sister, this is very close to her baseline before her UTI.  Objective: Vital signs in last 24 hours: Filed Vitals:   08/10/15 1720 08/10/15 2103 08/11/15 0100 08/11/15 0504  BP: 122/77 144/88 134/75 133/78  Pulse: 80 68 69 68  Temp: 97.6 F (36.4 C) 97.7 F (36.5 C) 98 F (36.7 C) 98.4 F (36.9 C)  TempSrc: Oral Oral Oral Oral  Resp: Height:      Weight:      SpO2: 90% 100% 96% 96%   General: cachectic appearing African-American lady, much more alert and cogent today HEENT: no scleral icterus, extra-ocular muscles intact, oropharynx without lesions Cardiac: irregular rhythm, no rubs, murmurs or gallops Pulm: breathing well, clear to auscultation bilaterally Abd: bowel sounds normal, soft, nondistended, non-tender Ext: warm and well perfused, without pedal edema. Extremities non-tender to palpation Lymph: no cervical, axillary, or supraclavicular lymphadenopathy Skin: no rash, hair, or nail changes Neuro: alert and oriented X3, cranial nerves II-XII grossly intact, strength 5/5 throughout, non-focal   Lab Results: Basic Metabolic Panel:  Recent Labs Lab 08/10/15 0215 08/11/15 0459  NA 139 138  K 4.4 4.2  CL 109 110  CO2 20* 23  GLUCOSE 136* 109*  BUN 37* 28*  CREATININE 1.31* 1.06*  CALCIUM 8.8* 8.3*   CBC:  Recent Labs Lab 08/09/15 0444 08/10/15 0215 08/11/15 0459  WBC 19.0* 13.9* 11.1*  NEUTROABS 16.1*  --   --   HGB 10.3* 10.3* 9.7*  HCT 30.4* 31.6* 29.6*  MCV 82.4 82.7 82.2  PLT 197 211 225   Micro Results: Recent Results (from the past 240 hour(s))  Culture, Urine     Status: None (Preliminary result)   Collection Time: 08/08/15  3:00 PM  Result Value Ref Range  Status   Specimen Description Urine  Final   Special Requests NONE  Final   Culture >=100,000 COLONIES/mL ESCHERICHIA COLI  Final   Report Status PENDING  Incomplete  Culture, blood (routine x 2)     Status: None (Preliminary result)   Collection Time: 08/08/15  4:41 PM  Result Value Ref Range Status   Specimen Description BLOOD RIGHT FOREARM  Final   Special Requests IN PEDIATRIC BOTTLE 3CC  Final   Culture  Setup Time   Final    GRAM NEGATIVE RODS AEROBIC BOTTLE ONLY CRITICAL RESULT CALLED TO, READ BACK BY AND VERIFIED WITH: A THOMPSON [id number] 08/09/15 MKELLY    Culture ESCHERICHIA COLI  Final   Report Status PENDING  Incomplete  Culture, blood (routine x 2)     Status: None (Preliminary result)   Collection Time: 08/08/15  4:46 PM  Result Value Ref Range Status   Specimen Description BLOOD RIGHT ARM  Final   Special Requests BOTTLES DRAWN AEROBIC AND ANAEROBIC 5CC  Final   Culture  Setup Time   Final    GRAM NEGATIVE RODS IN BOTH AEROBIC AND ANAEROBIC BOTTLES CRITICAL RESULT CALLED TO, READ BACK BY AND VERIFIED WITH: A THOMPSON,RN [id number] WILDERK    Culture ESCHERICHIA COLI  Final   Report Status PENDING  Incomplete   Studies/Results: Ct Abdomen Pelvis W Contrast  08/10/2015  CLINICAL DATA:  64 year old female with 200 pound  weight loss over the past year. History of tobacco and alcohol abuse. Multiple recent falls. Abdominal fullness. Evaluate for underlying malignancy. EXAM: CT ABDOMEN AND PELVIS WITH CONTRAST TECHNIQUE: Multidetector CT imaging of the abdomen and pelvis was performed using the standard protocol following bolus administration of intravenous contrast. CONTRAST:  OMNIPAQUE IOHEXOL 300 MG/ML  SOLN COMPARISON:  No priors. FINDINGS: Lower chest: Atherosclerotic calcifications in the right coronary artery. Hepatobiliary: The liver has a slightly shrunken appearance and nodular contour, suggestive of underlying cirrhosis. A few scattered tiny sub cm  low-attenuation lesions are too small to definitively characterize. In addition, there are 3 hypervascular lesions in segments 2, 6 and 7, the largest of which measures 9 mm in segment 7 (image 24 of series 2). Diffuse periportal edema. No intra or extrahepatic biliary ductal dilatation. Gallbladder is nearly completely decompressed, but otherwise unremarkable in appearance. Pancreas: No pancreatic mass. No pancreatic ductal dilatation. No pancreatic or peripancreatic fluid or inflammatory changes. Spleen: Unremarkable. Adrenals/Urinary Tract: Adreniform thickening of the adrenal glands bilaterally. Unusual appearance of the left kidney which appears partially effaced, with areas of hypoenhancement and poor corticomedullary differentiation, particularly notable on coronal images. Right kidney is normal in appearance. No hydroureteronephrosis. Urinary bladder is normal in appearance. Stomach/Bowel: Normal appearance of the stomach. No pathologic dilatation of small bowel or colon. Appendix is not confidently identified, but there are no overt inflammatory changes adjacent to the cecum to suggest presence of an acute appendicitis at this time. Vascular/Lymphatic: Atherosclerosis throughout the abdominal and pelvic vasculature, without evidence of aneurysm or dissection. No definite lymphadenopathy identified in the abdomen or pelvis (assessment is slightly limited by lack of intra-abdominal fat). Reproductive: Status post hysterectomy. Ovaries are not confidently identified may be surgically absent or atrophic. Regardless, no adnexal mass is identified on today's examination. Other: Trace volume of ascites.  No pneumoperitoneum. Musculoskeletal: 7 mm sclerotic lesion with narrow zone of transition in the left proximal femur is most compatible with a bone island. There are no other aggressive appearing lytic or blastic lesions noted in the visualized portions of the skeleton. IMPRESSION: 1. Unusual appearance of the  left kidney, concerning for potential pyelonephritis. Alternatively, a similar appearance could be seen in setting of renal lymphoma. Correlation with urinalysis is recommended. 2. Morphologic changes in the liver suggestive of early cirrhosis. Importantly, there are several indeterminate lesions, 3 of which are hypervascular. Correlation with nonemergent MRI of the abdomen with and without IV gadolinium (preferably Eovist) is recommended in the near future to exclude the possibility of hepatocellular carcinoma. 3. Small volume of ascites. 4. No adnexal mass identified. Ovaries are not confidently identified and may be surgically absent or atrophic. Patient is status post hysterectomy. 5. Atherosclerosis, including right coronary artery disease. Please note that although the presence of coronary artery calcium documents the presence of coronary artery disease, the severity of this disease and any potential stenosis cannot be assessed on this non-gated CT examination. Assessment for potential risk factor modification, dietary therapy or pharmacologic therapy may be warranted, if clinically indicated. 6. Additional incidental findings, as above. Electronically Signed   By: Trudie Reed M.D.   On: 08/10/2015 19:58   Medications: I have reviewed the patient's current medications. Scheduled Meds: . aspirin EC  81 mg Oral Daily  . cefTRIAXone (ROCEPHIN)  IV  2 g Intravenous Q24H  . enoxaparin (LOVENOX) injection  40 mg Subcutaneous Q24H  . feeding supplement (ENSURE ENLIVE)  237 mL Oral TID BM  . feeding supplement (PRO-STAT SUGAR  FREE 64)  30 mL Oral BID  . folic acid  1 mg Oral Daily  . levETIRAcetam  250 mg Oral BID  . multivitamin with minerals  1 tablet Oral Daily  . senna  1 tablet Oral BID  . thiamine  100 mg Oral Daily   Continuous Infusions: . sodium chloride 125 mL/hr at 08/11/15 0317   PRN Meds:.acetaminophen **OR** acetaminophen, ondansetron **OR** ondansetron (ZOFRAN) IV    Assessment/Plan:  Ms. Carla Marsh looked much more alert today after treating her pyelonephritis and I feel she's safe to go home once we can find her a SNF which will hopefully be tomorrow. We'll continue IV ceftriaxone today until we get cultures, hopefully this afternoon or tomorrow, then transition her to something like Cipro pending sensitivities. I spoke to her sister who agreed this would be the best course of action for her and she'd like her to be close in [place]. She's going to establish with another PCP here in [place] once she's discharged.  Regarding her unexplained weight loss, we now have a lead to her symptoms as her abdominal CT showed mild cirrhosis with some non-specific vascular lesions in her liver concerning for hepatocellular carcinoma. She has an extensive drinking history but we'll obtain hepatitis B and C labs as well as an AFP. She'll need to get an MRI to further characterize the lesions as an outpatient.  E coli bacteremia: Per above, she's not clinically septic. -Continue IV Ceftriaxone for now -Follow sensitivities -Drop NS from 125cc/hr to 50cc/hr  Pyelonephritis: Per above. -Follow urine cultures  200 pound weight loss in one year: Perhaps this is HCC. She'll need follow-up MRI. -Follow-up MRI -Follow-up HCV and HBV screening -Follow-up AFP  Hypertension: She is normotensive right now. -Holding amiloride 5mg  daily -Holding lisinopril 40mg  daily  Seizure disorder: Not an acute issue. -Continue levetiracetam 250mg  twice daily-  Alzheimer's dementia: She is clearly demented but more oriented today than yesterday. -Holding risperidone 2mg  twice daily -Holding B12 as she has supratherapeutic level  Dispo: Disposition is deferred at this time, awaiting improvement of current medical problems.   The patient does not have a current PCP (No primary care provider on file.) and [facility] after discharge.  The  patient does have transportation limitations that hinder transportation to clinic appointments.  .Services Needed at time of discharge: Y = Yes, Blank = No PT:   OT:   RN:   Equipment:   Other:     LOS: 3 days   Selina CooleyKyle Juhi Lagrange, MD 08/11/2015, 7:02 AM

## 2015-08-11 NOTE — Progress Notes (Signed)
Occupational Therapy Treatment Patient Details Name: Carla Marsh MRN: [id number] DOB: [birth year] Today's Date: 08/11/2015    History of present illness Adm 10/24 with recent falls and confusion; + UTI; workup to rule out metastasis (significant unintentional weight loss) PMHx-200 lb weight loss past year; progressive dementia, seizures, tobacco and ETOH abuse   OT comments  Pt progressing towards acute OT goals. Session details below. D/c plan remains appropriate.  Follow Up Recommendations  SNF    Equipment Recommendations  Other (comment) (defert o next venue)    Recommendations for Other Services      Precautions / Restrictions Precautions Precautions: Fall       Mobility Bed Mobility               General bed mobility comments: in recliner  Transfers Overall transfer level: Needs assistance Equipment used: None Transfers: Sit to/from Stand Sit to Stand: Min assist         General transfer comment: assist for balance    Balance     Sitting balance-Leahy Scale: Fair     Standing balance support: No upper extremity supported Standing balance-Leahy Scale: Poor Standing balance comment: unsteady, decreased safety awareness                   ADL Overall ADL's : Needs assistance/impaired                                     Functional mobility during ADLs: Minimal assistance General ADL Comments: Min A for balance iwth functional mobility with pt having decreased safety awareness. Issued level 1 theraband and therapy ball for BUE and grip strengthening. Pt pleasantly confused and still with inconsistent 1 step commmand following needing verbal and tactile cues.        Vision                     Perception     Praxis      Cognition   Behavior During Therapy: Flat affect (brighter) Overall Cognitive Status: No family/caregiver present to determine baseline cognitive functioning       Memory: Decreased  short-term memory               Extremity/Trunk Assessment               Exercises     Shoulder Instructions       General Comments      Pertinent Vitals/ Pain       Pain Assessment: No/denies pain  Home Living                                          Prior Functioning/Environment              Frequency Min 2X/week     Progress Toward Goals  OT Goals(current goals can now be found in the care plan section)  Progress towards OT goals: Progressing toward goals  Acute Rehab OT Goals Patient Stated Goal: get out of here OT Goal Formulation: With patient Time For Goal Achievement: 08/23/15 Potential to Achieve Goals: Good ADL Goals Pt Will Perform Grooming: with set-up;sitting Pt Will Perform Upper Body Bathing: with supervision;sitting Pt Will Perform Lower Body Bathing: with supervision;sit to/from stand Pt Will Perform Upper Body Dressing: with supervision;sitting Pt Will Perform  Lower Body Dressing: with supervision;sit to/from stand Pt Will Transfer to Toilet: with supervision;ambulating;bedside commode Pt Will Perform Toileting - Clothing Manipulation and hygiene: with supervision;sitting/lateral leans;sit to/from stand Pt Will Perform Tub/Shower Transfer: with min guard assist;ambulating;shower seat Pt/caregiver will Perform Home Exercise Program: Increased strength;Both right and left upper extremity;With written HEP provided  Plan Discharge plan remains appropriate    Co-evaluation                 End of Session     Activity Tolerance Patient tolerated treatment well   Patient Left in chair;with call bell/phone within reach;with nursing/sitter in room   Nurse Communication          Time: [id number] OT Time Calculation (min): 21 min  Charges: OT General Charges $OT Visit: 1 Procedure OT Treatments $Self Care/Home Management : 8-22 mins  Pilar GrammesMathews, Dalaysia Harms H 08/11/2015, 2:43 PM

## 2015-08-12 DIAGNOSIS — R935 Abnormal findings on diagnostic imaging of other abdominal regions, including retroperitoneum: Secondary | ICD-10-CM

## 2015-08-12 DIAGNOSIS — A4151 Sepsis due to Escherichia coli [E. coli]: Secondary | ICD-10-CM | POA: Insufficient documentation

## 2015-08-12 DIAGNOSIS — K769 Liver disease, unspecified: Secondary | ICD-10-CM | POA: Insufficient documentation

## 2015-08-12 DIAGNOSIS — K7031 Alcoholic cirrhosis of liver with ascites: Secondary | ICD-10-CM | POA: Insufficient documentation

## 2015-08-12 LAB — CULTURE, BLOOD (ROUTINE X 2)

## 2015-08-12 LAB — AFP TUMOR MARKER: AFP TUMOR MARKER: 2.3 ng/mL (ref 0.0–8.3)

## 2015-08-12 LAB — HEPATITIS B SURFACE ANTIGEN: Hepatitis B Surface Ag: NEGATIVE

## 2015-08-12 LAB — HCV COMMENT:

## 2015-08-12 LAB — HEPATITIS C ANTIBODY (REFLEX): HCV Ab: <0.1 s/co ratio (ref 0.0–0.9)

## 2015-08-12 MED ORDER — CIPROFLOXACIN HCL 250 MG PO TABS: 250.0000 mg | ORAL_TABLET | Freq: Two times a day (BID) | ORAL | Status: DC

## 2015-08-12 MED ORDER — CIPROFLOXACIN HCL 500 MG PO TABS: 500.0000 mg | ORAL_TABLET | Freq: Two times a day (BID) | ORAL | Status: AC

## 2015-08-12 MED ORDER — SENNA 8.6 MG PO TABS: 1.0000 | ORAL_TABLET | Freq: Two times a day (BID) | ORAL | Status: DC

## 2015-08-12 MED ORDER — RISPERIDONE 0.5 MG PO TABS
1.0000 mg | ORAL_TABLET | Freq: Two times a day (BID) | ORAL | Status: DC
  Administered 2015-08-12: 1 mg via ORAL
  Filled 2015-08-12: qty 2

## 2015-08-12 MED ORDER — CIPROFLOXACIN HCL 250 MG PO TABS
250.0000 mg | ORAL_TABLET | Freq: Two times a day (BID) | ORAL | Status: DC
  Administered 2015-08-12: 250 mg via ORAL
  Filled 2015-08-12: qty 1

## 2015-08-12 MED ORDER — PRO-STAT SUGAR FREE PO LIQD: 30.0000 mL | Freq: Two times a day (BID) | ORAL | Status: DC

## 2015-08-12 MED ORDER — ENSURE ENLIVE PO LIQD: 237.0000 mL | Freq: Three times a day (TID) | ORAL | Status: DC

## 2015-08-12 MED ORDER — RISPERIDONE 1 MG PO TABS: 1.0000 mg | ORAL_TABLET | Freq: Every day | ORAL | Status: AC

## 2015-08-12 MED ORDER — SENNA 8.6 MG PO TABS: 1.0000 | ORAL_TABLET | Freq: Two times a day (BID) | ORAL | Status: AC

## 2015-08-12 MED ORDER — PRO-STAT SUGAR FREE PO LIQD: 30.0000 mL | Freq: Two times a day (BID) | ORAL | Status: AC

## 2015-08-12 MED ORDER — RISPERIDONE 1 MG PO TABS: 1.0000 mg | ORAL_TABLET | Freq: Every day | ORAL | Status: DC

## 2015-08-12 MED ORDER — CIPROFLOXACIN HCL 500 MG PO TABS: 500.0000 mg | ORAL_TABLET | Freq: Two times a day (BID) | ORAL | Status: DC

## 2015-08-12 MED ORDER — ENSURE ENLIVE PO LIQD: 237.0000 mL | Freq: Three times a day (TID) | ORAL | Status: AC

## 2015-08-12 NOTE — Clinical Social Work Note (Addendum)
CSW spoke to patient and her sister Delray AltMargie, patient was very confused, yelling at sitter and sister, and some wandering.  Patient's sister expressed patient is not normally like this and she was concerned with behavioral issues.  Sister reported that patient used to drink heavily and has been showing signs of dementia.  Patient lives with her mother who has Alzheimer's and is usually the care giver when home aid is not there.  Patient's sister reported that patient has been wandering more and was receiving dementia medication while she was at home.  Patient's sister concerned about increase in confusion, and behavioral concerns.  Patient still does not have a bed available for SNF, CSW continuing search process.  Ervin KnackEric R. Nancee Brownrigg, MSW, LCSWA [phone] 08/12/2015 8:20 AM

## 2015-08-12 NOTE — Progress Notes (Signed)
Patient is being d/c home. Dc instructions given and patient verbalized understanding. Will continue to monitor. 

## 2015-08-12 NOTE — Progress Notes (Signed)
Paged MD regarding the patient's pharmacy in [place].

## 2015-08-12 NOTE — Discharge Instructions (Signed)
Pyelonephritis, Adult Pyelonephritis is a kidney infection. The kidneys are organs that help clean your blood by moving waste out of your blood and into your pee (urine). This infection can happen quickly, or it can last for a long time. In most cases, it clears up with treatment and does not cause other problems. HOME CARE Medicines  Take over-the-counter and prescription medicines only as told by your doctor.  Take your antibiotic medicine as told by your doctor. Do not stop taking the medicine even if you start to feel better. General Instructions  Drink enough fluid to keep your pee clear or pale yellow.  Avoid caffeine, tea, and carbonated drinks.  Pee (urinate) often. Avoid holding in pee for long periods of time.  Pee before and after sex.  After pooping (having a bowel movement), women should wipe from front to back. Use each tissue only once.  Keep all follow-up visits as told by your doctor. This is important. GET HELP IF:  You do not feel better after 2 days.  Your symptoms get worse.  You have a fever. GET HELP RIGHT AWAY IF:  You cannot take your medicine or drink fluids as told.  You have chills and shaking.  You throw up (vomit).  You have very bad pain in your side (flank) or back.  You feel very weak or you pass out (faint).   This information is not intended to replace advice given to you by your health care provider. Make sure you discuss any questions you have with your health care provider.   Document Released: 11/08/2004 Document Revised: 06/22/2015 Document Reviewed: 01/24/2015 Elsevier Interactive Patient Education 2016 Else[employer] Inc.  Alzheimer Disease Caregiver Guide Alzheimer disease is an illness that affects a person's brain. It causes a person to lose the ability to remember things and make good decisions. As the disease progresses, the person is unable to take care of himself or herself and needs more and more help to do simple tasks.  Taking care of someone with Alzheimer disease can be very challenging and overwhelming.  MEMORY LOSS AND CONFUSION Memory loss and confusion is mild in the beginning stages of the disease. Both of these problems become more severe as the disease progresses. Eventually, the person will not recognize places or even close family members and friends.   Stay calm.  Respond with a short explanation. Long explanations can be overwhelming and confusing.  Avoid corrections that sound like scolding.  Try not to take it personally, even if the person forgets your name. BEHAVIOR CHANGES Behavior changes are part of the disease. The person may develop depression, anxiety, anger, hallucinations, or other behavior changes. These changes can come on suddenly and may be in response to pain, infection, changes in the environment (temperature, noise), overstimulation, or feeling lost or scared.   Try not to take behavior changes personally.  Remain calm and patient.  Do not argue or try to convince the person about a specific point. This will only make him or her more agitated.  Know that the behavior changes are part of the disease process and try to work through it. TIPS TO REDUCE FRUSTRATION  Schedule wisely by making appointments and doing daily tasks, like bathing and dressing, when the person is at his or her best.  Take your time. Simple tasks may take a lot longer, so be sure to allow for plenty of time.  Limit choices. Too many choices can be overwhelming and stressful for the person.  Involve  the person in what you are doing.  Stick to a routine.  Avoid new or crowded situations, if possible.  Use simple words, short sentences, and a calm voice. Only give one direction at a time.  Buy clothes and shoes that are easy to put on and take off.  Let people help if they offer. HOME SAFETY Keeping the home safe is very important to reduce the risk of falls and injuries.   Keep floors clear  of clutter. Remove rugs, magazine racks, and floor lamps.  Keep hallways well lit.  Put a handrail and nonslip mat in the bathtub or shower.  Put childproof locks on cabinets with dangerous items, such as medicine, alcohol, guns, toxic cleaning items, sharp tools or utensils, matches, or lighters.  Place locks on doors where the person cannot easily see or reach them. This helps ensure that the person cannot wander out of the house and get lost.  Be prepared for emergencies. Keep a list of emergency phone numbers and addresses in a convenient area. PLANS FOR THE FUTURE  Do not put off talking about finances.  Talk about money management. People with Alzheimer disease have trouble managing their money as the disease gets worse.  Get help from professional advisors regarding financial and legal matters.  Do not put off talking about future care.  Choose a power of attorney. This is someone who can make decisions for the person with Alzheimer disease when he or she is no longer able to do so.  Talk about driving and when it is the right time to stop. The person's health care provider can help give advice on this matter.  Talk about the person's living situation. If he or she lives alone, you need to make sure he or she is safe. Some people need extra help at home, and others need more care at a nursing home or care center. SUPPORT GROUPS Joining a support group can be very helpful for caregivers of people with Alzheimer disease. Some advantages to being part of a support group include:   Getting strategies to manage stress.  Sharing experiences with others.  Receiving emotional comfort and support.  Learning new caregiving skills as the disease progresses.  Knowing what community resources are available and taking advantage of them. SEEK MEDICAL CARE IF:  The person has a fever.  The person has a sudden change in behavior that does not improve with calming strategies.  The  person is unable to manage in his or her current living situation.  The person threatens you or anyone else, including himself or herself.  You are no longer able to care for the person.   This information is not intended to replace advice given to you by your health care provider. Make sure you discuss any questions you have with your health care provider.   Document Released: 06/12/2004 Document Revised: 10/22/2014 Document Reviewed: 11/07/2011 Elsevier Interactive Patient Education [employer] Else[employer] Inc[employer]

## 2015-08-12 NOTE — Progress Notes (Signed)
Patient ID: Carla AlkenEster Marsh, female   DOB: [birth year], 64 y.o.   MRN: [id number]   Subjective: Carla Marsh was sleepy but arousable this morning. She said she's feeling fine and doesn't have any complaints, except for me to leave the room so she can continue sleeping. I told her I'd check on her later.  Objective: Vital signs in last 24 hours: Filed Vitals:   08/11/15 1410 08/11/15 1836 08/12/15 0100 08/12/15 0518  BP: 128/74 136/68 134/70 160/83  Pulse: 68 68 70 59  Temp: 98.1 F (36.7 C) 98 F (36.7 C) 98.5 F (36.9 C) 98.5 F (36.9 C)  TempSrc: Oral Oral Oral Oral  Resp: 18 18 20 18   Height:      Weight:      SpO2: 100% 100% 100% 99%   General: cachectic appearing African-American lady, sleeping but arousable HEENT: no scleral icterus, extra-ocular muscles intact Cardiac: irregular rhythm, no rubs, murmurs or gallops Pulm: breathing well, clear to auscultation bilaterally Abd: bowel sounds normal, soft, nondistended, non-tender Ext: warm and well perfused, without pedal edema. Extremities non-tender to palpation Lymph: no cervical, axillary, or supraclavicular lymphadenopathy Skin: no rash, hair, or nail changes Neuro: alert and oriented X3, cranial nerves II-XII grossly intact  Lab Results:  Micro Results: Recent Results (from the past 240 hour(s))  Culture, Urine     Status: None   Collection Time: 08/08/15  3:00 PM  Result Value Ref Range Status   Specimen Description URINE, RANDOM  Final   Special Requests NONE  Final   Culture >=100,000 COLONIES/mL ESCHERICHIA Marsh  Final   Report Status 08/11/2015 FINAL  Final   Organism ID, Bacteria ESCHERICHIA Marsh  Final      Susceptibility   Escherichia Marsh - MIC*    AMPICILLIN <=2 SENSITIVE Sensitive     CEFAZOLIN <=4 SENSITIVE Sensitive     CEFTRIAXONE <=1 SENSITIVE Sensitive     CIPROFLOXACIN <=0.25 SENSITIVE Sensitive     GENTAMICIN <=1 SENSITIVE Sensitive     IMIPENEM <=0.25 SENSITIVE Sensitive    NITROFURANTOIN <=16 SENSITIVE Sensitive     TRIMETH/SULFA >=320 RESISTANT Resistant     AMPICILLIN/SULBACTAM <=2 SENSITIVE Sensitive     PIP/TAZO <=4 SENSITIVE Sensitive     * >=100,000 COLONIES/mL ESCHERICHIA Marsh  Culture, blood (routine x 2)     Status: None   Collection Time: 08/08/15  4:41 PM  Result Value Ref Range Status   Specimen Description BLOOD RIGHT FOREARM  Final   Special Requests IN PEDIATRIC BOTTLE 3CC  Final   Culture  Setup Time   Final    GRAM NEGATIVE RODS AEROBIC BOTTLE ONLY CRITICAL RESULT CALLED TO, READ BACK BY AND VERIFIED WITH: A THOMPSON [id number] 08/09/15 MKELLY    Culture ESCHERICHIA Marsh  Final   Report Status 08/12/2015 FINAL  Final   Organism ID, Bacteria ESCHERICHIA Marsh  Final      Susceptibility   Escherichia Marsh - MIC*    AMPICILLIN <=2 SENSITIVE Sensitive     CEFAZOLIN <=4 SENSITIVE Sensitive     CEFEPIME <=1 SENSITIVE Sensitive     CEFTAZIDIME <=1 SENSITIVE Sensitive     CEFTRIAXONE <=1 SENSITIVE Sensitive     CIPROFLOXACIN <=0.25 SENSITIVE Sensitive     GENTAMICIN <=1 SENSITIVE Sensitive     IMIPENEM <=0.25 SENSITIVE Sensitive     TRIMETH/SULFA >=320 RESISTANT Resistant     AMPICILLIN/SULBACTAM <=2 SENSITIVE Sensitive     PIP/TAZO <=4 SENSITIVE Sensitive     * ESCHERICHIA Marsh  Culture, blood (routine  x 2)     Status: None   Collection Time: 08/08/15  4:46 PM  Result Value Ref Range Status   Specimen Description BLOOD RIGHT ARM  Final   Special Requests BOTTLES DRAWN AEROBIC AND ANAEROBIC 5CC  Final   Culture  Setup Time   Final    GRAM NEGATIVE RODS IN BOTH AEROBIC AND ANAEROBIC BOTTLES CRITICAL RESULT CALLED TO, READ BACK BY AND VERIFIED WITH: A THOMPSON,RN [id number]    Culture   Final    ESCHERICHIA Marsh SUSCEPTIBILITIES PERFORMED ON PREVIOUS CULTURE WITHIN THE LAST 5 DAYS.    Report Status 08/12/2015 FINAL  Final  Culture, blood (routine x 2)     Status: None (Preliminary result)   Collection Time: 08/11/15  2:45 PM    Result Value Ref Range Status   Specimen Description BLOOD RIGHT ANTECUBITAL  Final   Special Requests BOTTLES DRAWN AEROBIC AND ANAEROBIC 5CC  Final   Culture PENDING  Incomplete   Report Status PENDING  Incomplete  Culture, blood (routine x 2)     Status: None (Preliminary result)   Collection Time: 08/11/15  2:50 PM  Result Value Ref Range Status   Specimen Description BLOOD LEFT ANTECUBITAL  Final   Special Requests BOTTLES DRAWN AEROBIC AND ANAEROBIC 5CC  Final   Culture PENDING  Incomplete   Report Status PENDING  Incomplete   Medications: I have reviewed the patient's current medications. Scheduled Meds: . aspirin EC  81 mg Oral Daily  . ciprofloxacin  250 mg Oral BID  . enoxaparin (LOVENOX) injection  40 mg Subcutaneous Q24H  . feeding supplement (ENSURE ENLIVE)  237 mL Oral TID BM  . feeding supplement (PRO-STAT SUGAR FREE 64)  30 mL Oral BID  . folic acid  1 mg Oral Daily  . levETIRAcetam  250 mg Oral BID  . multivitamin with minerals  1 tablet Oral Daily  . senna  1 tablet Oral BID  . thiamine  100 mg Oral Daily   Continuous Infusions:  PRN Meds:.acetaminophen **OR** acetaminophen, ondansetron **OR** ondansetron (ZOFRAN) IV   Assessment/Plan:  Carla Marsh bacteremia continues to improve and she's much more alert than when she first arrived, and back to her cognitive baseline according to her sister. Her blood culture sensitivities are not back yet but her urine sensitivities showed E Marsh resistant to Bactrim but sensitive to Ciprofloxacin, so we'll start her on the latter this morning for a total of 14 day course. Regarding her questionable hepatocellular carcinoma, she'll need quick follow-up with her PCP in [place] which her sister is already working on, in order to get the follow-up MRI and perhaps TVUS to evaluate for ovarian cancer. Her hepatitis B and C screens were negative and her AFP level was normal. She's medically-cleared to go home with home  health. Unfortunately she cannot go to SNF as her insurance doesn't cover it.  E Marsh bacteremia from pyelonephritis: Per above, she's improved considerably and is now back to her baseline. -Changed IV ceftriaxone to ciprofloxacin for a 14 day course -Following blood culture sensitivities  200 pound weight loss in one year: Perhaps this is HCC. Hep B and C were negative and AFP was normal. She'll need follow-up MRI. -Follow-up MRI as outpatient  Hypertension: She is normotensive right now. We'll discontinue her antihypertensives upon discharge. -Holding amiloride  daily -Holding lisinopril  daily  Seizure disorder: Not an acute issue. -Continue levetiracetam  twice daily-  Alzheimer's dementia: She is clearly demented but  less somnolent than yesterday. -Re-start risperidone  twice daily -Holding B12 as she has supratherapeutic level  Dispo: Discharge to home today with home health.  The patient does not have a current PCP (No primary care provider on file.) and does need an [facility] hospital follow-up appointment after discharge.  The patient does have transportation limitations that hinder transportation to clinic appointments.  .Services Needed at time of discharge: Y = Yes, Blank = No PT:   OT:   RN:   Equipment:   Other:     LOS: 4 days   Selina Cooley, MD 08/12/2015, 7:06 AM

## 2015-08-12 NOTE — Care Management Note (Signed)
Case Management Note  Patient Details  Name: Soundra Lampley MRN: [id number] Date of Birth: [birth year]  Subjective/Objective:                    Action/Plan: Patient unable to go to SNF due to insurance benefits. Patient being discharged home today with home health services. CM met with the patients sister and provided her a list of home health agencies in the [place] area. Ms Cheryll Cockayne (sister) selected [facility] health, but they were not able to accept the patient. CM spoke with several other agencies and was able to obtain an accepted referral from [facility]. CM spoke with Ms Cheryll Cockayne and she was in agreement with using [facility].  Information required from [place] was faxed to number provided: [phone]. CM also provided Ms Cheryll Cockayne a private duty list to assist her with care for her sister. CM informed Ms Cheryll Cockayne that these were private pay. Bedside RN updated.   Date Medicare IM Given:    Medicare IM give by:    Date Additional Medicare IM Given:    Additional Medicare Important Message give by:     If discussed at [place] of Stay Meetings, dates discussed:    Additional Comments:  Pollie Friar, RN 08/12/2015, 3:09 PM

## 2015-08-12 NOTE — Clinical Social Work Note (Addendum)
CSW was informed that due to patient's insurance she does not have skilled nursing benefits for placement.  CSW informed patient's sister Delray AltMargie that due to patient's insurance she would not be able to go to a SNF for short term rehab.  CSW informed physician and RN case Production designer, theatre/television/filmmanager.  CSW informed sister that she would have to look at getting care at patient's home and the case manager can help give her a list of options along with getting home health nursing, pt, ot, social work and an Engineer, productionaide.  CSW informed the sister that she should contact [place] dept of social services to see if patient would be eligible for disability and long term care medicaid.  CSW also suggested to patient's sister that open enrollment will be starting next week, and she could explore helping patient change insurance that may have SNF benefits.  Patient's sister asked about having patient move somewhere for long term care, CSW explained to her that she would have to explore options in [place] if patient applies for [place] long term care Medicaid since she lives up there.  If patient moves to [place], the sister would have to help patient apply for [place] long term care medicaid and she can look at places in the [place] area.  CSW to sign off, please reconsult with other social work needs.  Ervin KnackEric R. Sahith Nurse, MSW, Theresia MajorsLCSWA [phone] 08/12/2015 12:52 PM

## 2015-08-15 NOTE — Progress Notes (Addendum)
08/15/15-CM received a phone call this am from [facility] that they were not going to be able to [facility]. [facility] and [facility] the [facility] and will be able to have someone out to see the patient tomorrow. CM left a message with Ms Ala Dachmore notifying her of the change in home health and when someone will be out to see her sister.

## 2015-08-16 LAB — CULTURE, BLOOD (ROUTINE X 2)
CULTURE: NO GROWTH
Culture: NO GROWTH

## 2015-09-15 DEATH — deceased

## 2017-05-20 IMAGING — DX DG FOREARM 2V*R*
2 series · 2 of 2 positions shown · non-contrast
Comparison: None.

CLINICAL DATA: Hx of multiple falls since feburary, most recent
fall was 2 days ago when pt fell and pulled dresser over unto
herself. Bilateral shoulder pain. Bilateral humerus and forearm
pain. Generalized bilateral rib pain.

EXAM:
RIGHT FOREARM - 2 VIEW

[forearm ap]
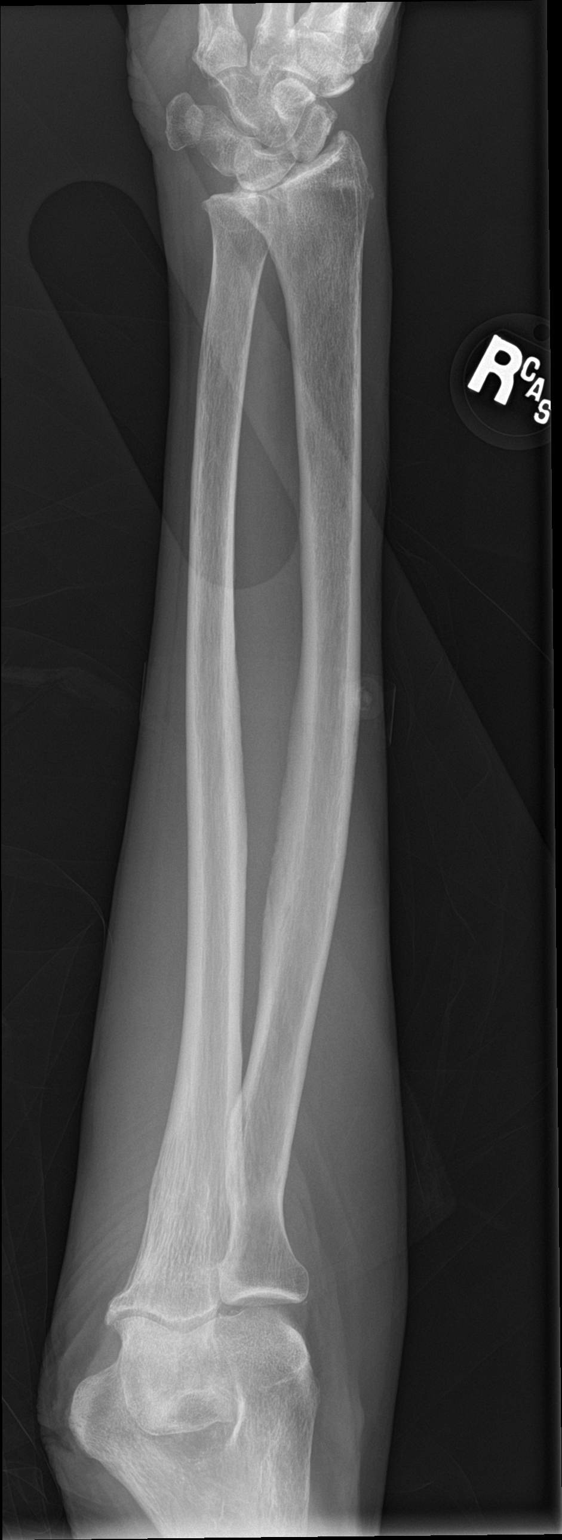

[forearm lat]
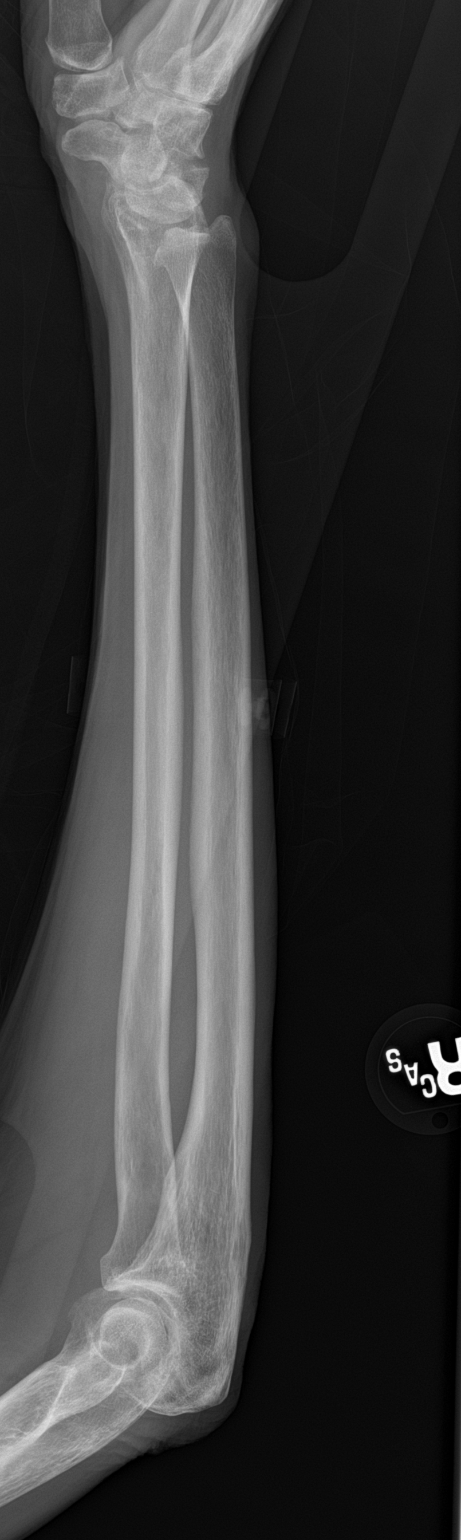

[2 of 2 positions shown; findings below may reference images not displayed]

FINDINGS: No convincing acute fracture. Wrist and elbow joints are normally
aligned. Bones are demineralized. Soft tissues are unremarkable.
IMPRESSION: No acute fracture or dislocation.

## 2017-05-21 IMAGING — CT CT ABD-PELV W/ CM
2 of 5 series · 14 of 46 positions shown, 16 images · IV contrast (omnipaque)
Comparison: No priors.

CLINICAL DATA: 64-year-old female with 200 pound weight loss over
the past year. History of tobacco and alcohol abuse. Multiple recent
falls. Abdominal fullness. Evaluate for underlying malignancy.

EXAM:
CT ABDOMEN AND PELVIS WITH CONTRAST
TECHNIQUE: Multidetector CT imaging of the abdomen and pelvis was performed
using the standard protocol following bolus administration of
intravenous contrast.
CONTRAST:  100mL OMNIPAQUE IOHEXOL 300 MG/ML  SOLN

[Series 2: abd/ pelvis 5.0 i30f 1 · axial · 0.65mm/px · z∈[+808,+1228]mm · 11 of 94 slices shown, 13 images]
[im 5/94  soft-tissue]
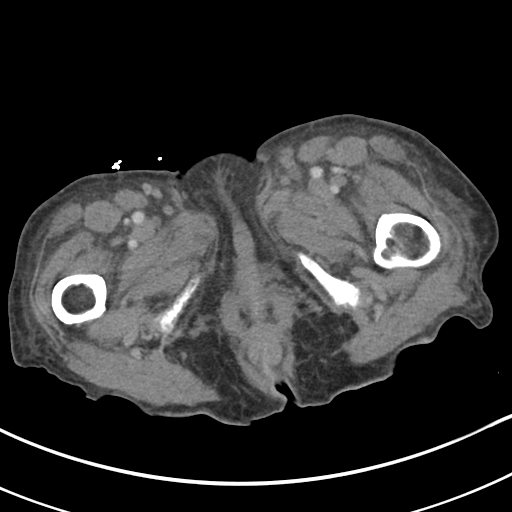
[im 5/94  bone]
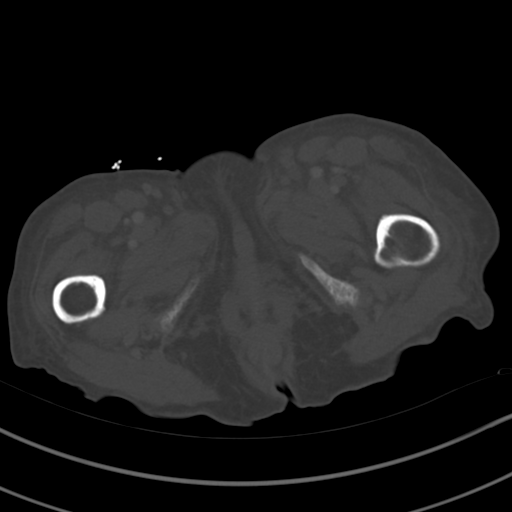
[im 15/94  soft-tissue]
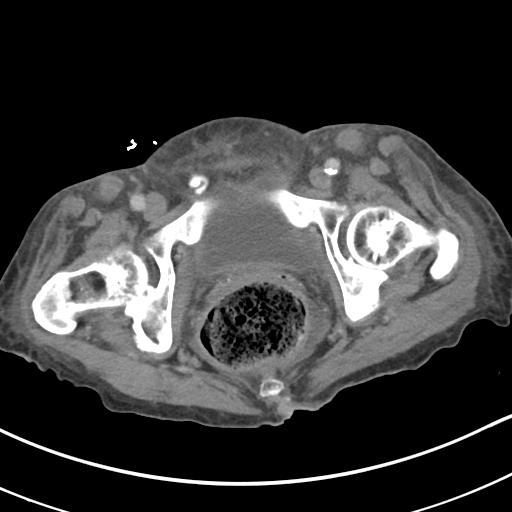
[im 25/94  soft-tissue]
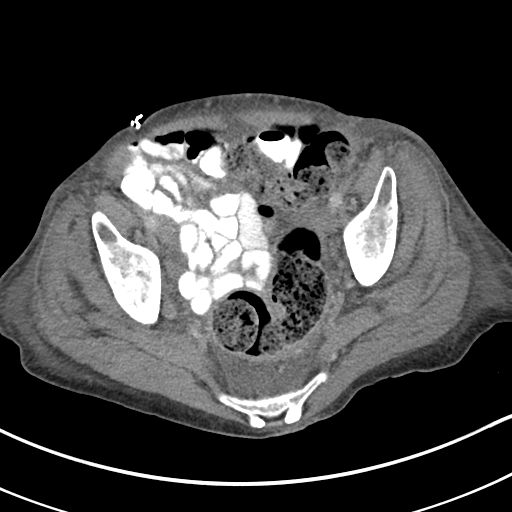
[im 30/94  soft-tissue]
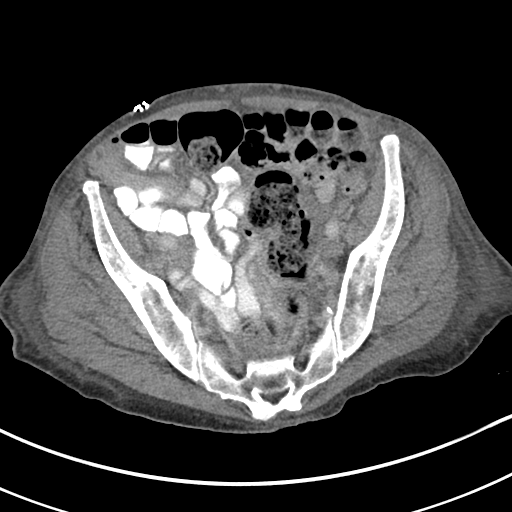
[im 40/94  soft-tissue]
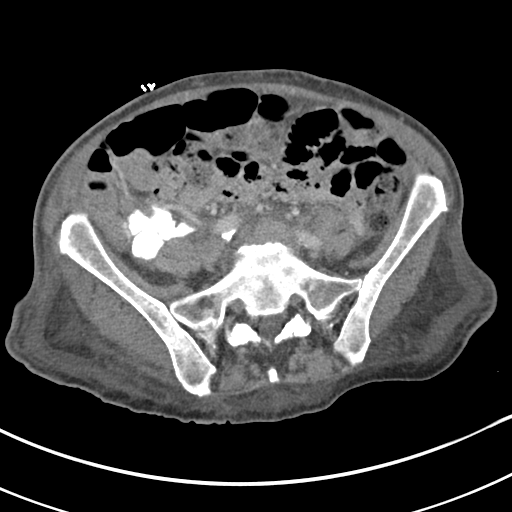
[im 49/94  soft-tissue]
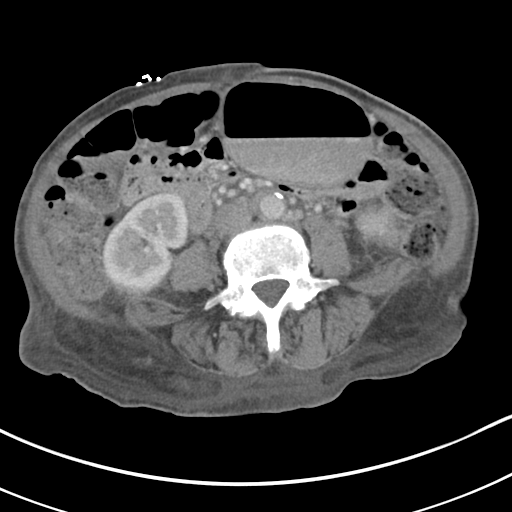
[im 54/94  soft-tissue]
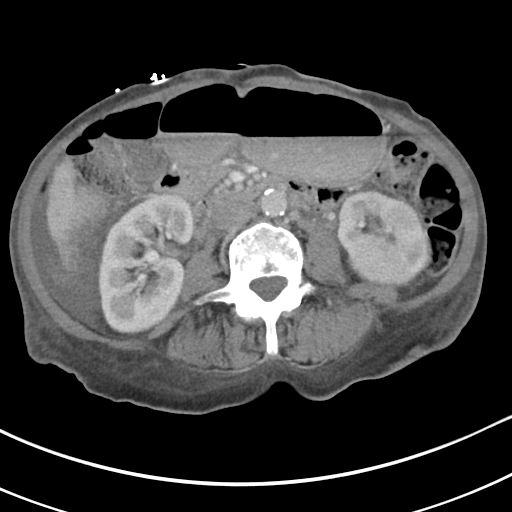
[im 64/94  soft-tissue]
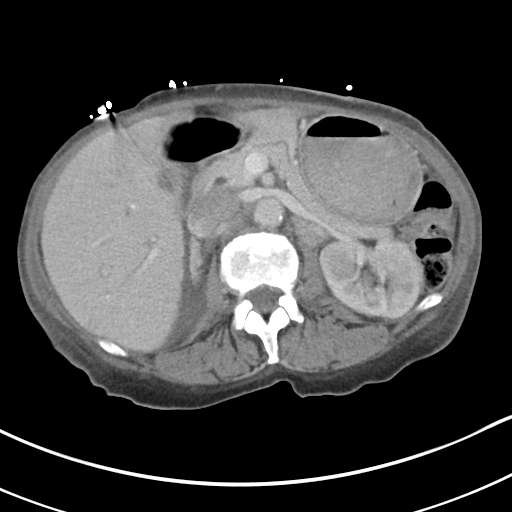
[im 69/94  soft-tissue]
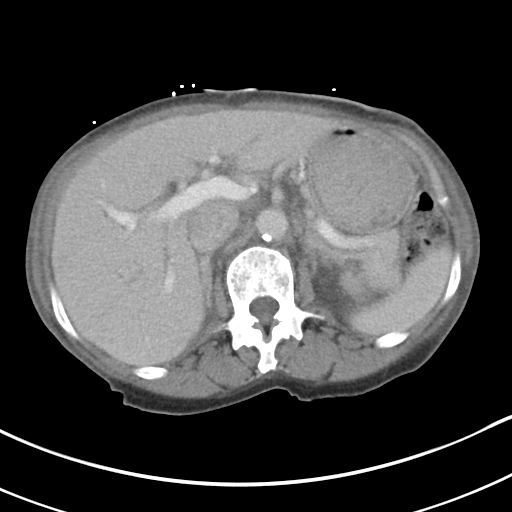
[im 69/94  bone]
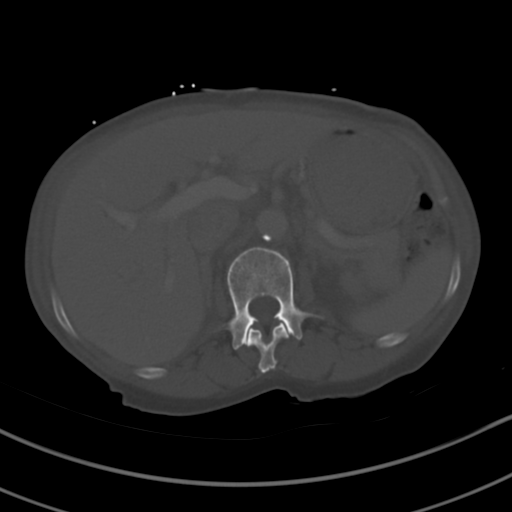
[im 79/94  soft-tissue]
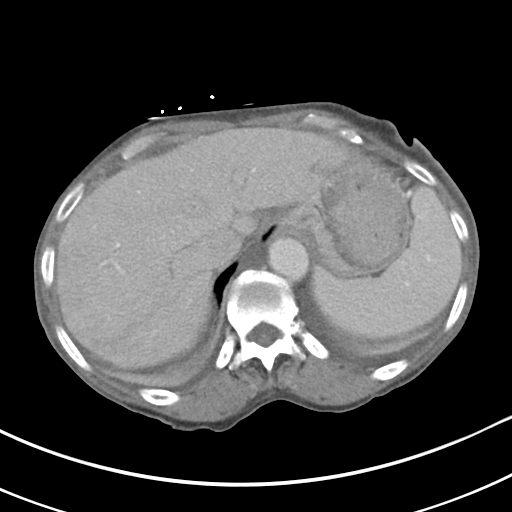
[im 89/94  soft-tissue]
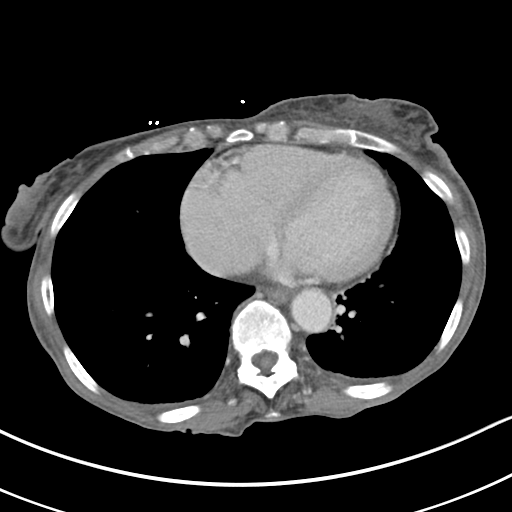

[Series 5: coronal soft tissue · coronal · 0.67mm/px · 3 of 87 slices shown]
[im 29/87  soft-tissue]
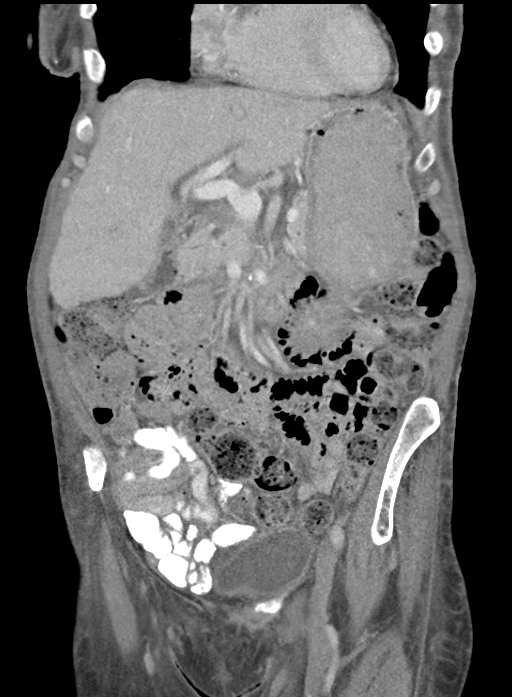
[im 39/87  soft-tissue]
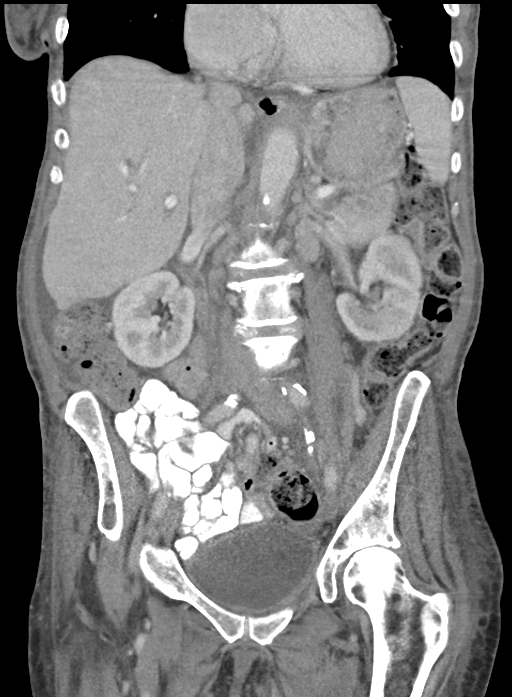
[im 48/87  soft-tissue]
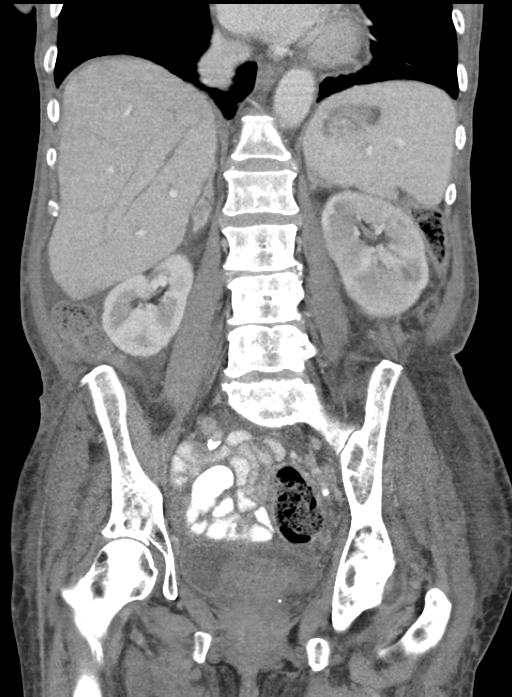

[14 of 46 positions shown; findings below may reference images not displayed]

FINDINGS: Lower chest: Atherosclerotic calcifications in the right coronary
artery.

Hepatobiliary: The liver has a slightly shrunken appearance and
nodular contour, suggestive of underlying cirrhosis. A few scattered
tiny sub cm low-attenuation lesions are too small to definitively
characterize. In addition, there are 3 hypervascular lesions in
segments 2, 6 and 7, the largest of which measures 9 mm in segment 7
(image 24 of series 2). Diffuse periportal edema. No intra or
extrahepatic biliary ductal dilatation. Gallbladder is nearly
completely decompressed, but otherwise unremarkable in appearance.

Pancreas: No pancreatic mass. No pancreatic ductal dilatation. No
pancreatic or peripancreatic fluid or inflammatory changes.

Spleen: Unremarkable.

Adrenals/Urinary Tract: Adreniform thickening of the adrenal glands
bilaterally. Unusual appearance of the left kidney which appears
partially effaced, with areas of hypoenhancement and poor
corticomedullary differentiation, particularly notable on coronal
images. Right kidney is normal in appearance. No
hydroureteronephrosis. Urinary bladder is normal in appearance.

Stomach/Bowel: Normal appearance of the stomach. No pathologic
dilatation of small bowel or colon. Appendix is not confidently
identified, but there are no overt inflammatory changes adjacent to
the cecum to suggest presence of an acute appendicitis at this time.

Vascular/Lymphatic: Atherosclerosis throughout the abdominal and
pelvic vasculature, without evidence of aneurysm or dissection. No
definite lymphadenopathy identified in the abdomen or pelvis
(assessment is slightly limited by lack of intra-abdominal fat).

Reproductive: Status post hysterectomy. Ovaries are not confidently
identified may be surgically absent or atrophic. Regardless, no
adnexal mass is identified on today's examination.

Other: Trace volume of ascites.  No pneumoperitoneum.

Musculoskeletal: 7 mm sclerotic lesion with narrow zone of
transition in the left proximal femur is most compatible with a bone
island. There are no other aggressive appearing lytic or blastic
lesions noted in the visualized portions of the skeleton.
IMPRESSION: 1. Unusual appearance of the left kidney, concerning for potential
pyelonephritis. Alternatively, a similar appearance could be seen in
setting of renal lymphoma. Correlation with urinalysis is
recommended.
2. Morphologic changes in the liver suggestive of early cirrhosis.
Importantly, there are several indeterminate lesions, 3 of which are
hypervascular. Correlation with nonemergent MRI of the abdomen with
and without IV gadolinium (preferably Eovist) is recommended in the
near future to exclude the possibility of hepatocellular carcinoma.
3. Small volume of ascites.
4. No adnexal mass identified. Ovaries are not confidently
identified and may be surgically absent or atrophic. Patient is
status post hysterectomy.
5. Atherosclerosis, including right coronary artery disease. Please
note that although the presence of coronary artery calcium documents
the presence of coronary artery disease, the severity of this
disease and any potential stenosis cannot be assessed on this
non-gated CT examination. Assessment for potential risk factor
modification, dietary therapy or pharmacologic therapy may be
warranted, if clinically indicated.
6. Additional incidental findings, as above.
